# Patient Record
Sex: Male | Born: 1950
Health system: Southern US, Community
[De-identification: ages and names within clinical notes are randomized; demographics above are authoritative.]

## PROBLEM LIST (undated history)

## (undated) DIAGNOSIS — G44009 Cluster headache syndrome, unspecified, not intractable: Secondary | ICD-10-CM

## (undated) DIAGNOSIS — IMO0001 Reserved for inherently not codable concepts without codable children: Secondary | ICD-10-CM

## (undated) DIAGNOSIS — J45909 Unspecified asthma, uncomplicated: Secondary | ICD-10-CM

## (undated) DIAGNOSIS — K219 Gastro-esophageal reflux disease without esophagitis: Secondary | ICD-10-CM

## (undated) DIAGNOSIS — J329 Chronic sinusitis, unspecified: Secondary | ICD-10-CM

## (undated) DIAGNOSIS — C61 Malignant neoplasm of prostate: Secondary | ICD-10-CM

## (undated) HISTORY — PX: KNEE ARTHROPLASTY: SHX992

## (undated) HISTORY — DX: Unspecified asthma, uncomplicated: J45.909

## (undated) HISTORY — DX: Chronic sinusitis, unspecified: J32.9

## (undated) HISTORY — DX: Reserved for inherently not codable concepts without codable children: IMO0001

## (undated) HISTORY — DX: Gastro-esophageal reflux disease without esophagitis: K21.9

## (undated) HISTORY — DX: Cluster headache syndrome, unspecified, not intractable: G44.009

## (undated) HISTORY — DX: Malignant neoplasm of prostate: C61

## (undated) HISTORY — PX: SINOSCOPY: SHX187

## (undated) HISTORY — PX: CATARACT EXTRACTION: SUR2

## (undated) HISTORY — PX: OTHER SURGICAL HISTORY: SHX169

---

## 1988-09-13 DIAGNOSIS — T8859XA Other complications of anesthesia, initial encounter: Secondary | ICD-10-CM

## 1988-09-13 HISTORY — DX: Other complications of anesthesia, initial encounter: T88.59XA

## 1998-07-31 ENCOUNTER — Ambulatory Visit (HOSPITAL_COMMUNITY): Admission: RE | Admit: 1998-07-31 | Discharge: 1998-07-31 | Payer: Self-pay | Admitting: Neurological Surgery

## 1998-07-31 ENCOUNTER — Encounter: Payer: Self-pay | Admitting: Neurological Surgery

## 1998-08-18 ENCOUNTER — Encounter: Admission: RE | Admit: 1998-08-18 | Discharge: 1998-11-16 | Payer: Self-pay | Admitting: Neurological Surgery

## 2000-07-14 ENCOUNTER — Encounter: Admission: RE | Admit: 2000-07-14 | Discharge: 2000-07-14 | Payer: Self-pay | Admitting: Orthopedic Surgery

## 2000-07-14 ENCOUNTER — Encounter: Payer: Self-pay | Admitting: Orthopedic Surgery

## 2005-08-13 ENCOUNTER — Encounter: Admission: RE | Admit: 2005-08-13 | Discharge: 2005-08-13 | Payer: Self-pay | Admitting: Orthopaedic Surgery

## 2005-08-17 ENCOUNTER — Ambulatory Visit (HOSPITAL_COMMUNITY): Admission: RE | Admit: 2005-08-17 | Discharge: 2005-08-17 | Payer: Self-pay | Admitting: Orthopaedic Surgery

## 2005-08-17 ENCOUNTER — Ambulatory Visit (HOSPITAL_BASED_OUTPATIENT_CLINIC_OR_DEPARTMENT_OTHER): Admission: RE | Admit: 2005-08-17 | Discharge: 2005-08-17 | Payer: Self-pay | Admitting: Orthopaedic Surgery

## 2007-08-29 ENCOUNTER — Ambulatory Visit (HOSPITAL_BASED_OUTPATIENT_CLINIC_OR_DEPARTMENT_OTHER): Admission: RE | Admit: 2007-08-29 | Discharge: 2007-08-29 | Payer: Self-pay | Admitting: Orthopaedic Surgery

## 2011-01-26 NOTE — Op Note (Signed)
NAMEJOURDON, Kyle [name]         ACCOUNT NO.:  [id number]   MEDICAL RECORD NO.:  [id number]          PATIENT TYPE:  AMB   LOCATION:  DSC                          FACILITY:  [facility]   PHYSICIAN:  Lubertha Basque. Dalldorf, M.D.DATE OF BIRTH:  06-05-1951   DATE OF PROCEDURE:  08/29/2007  DATE OF DISCHARGE:                               OPERATIVE REPORT   PREOPERATIVE DIAGNOSIS:  Left knee torn lateral meniscus.   POSTOPERATIVE DIAGNOSES:  1. Left knee torn lateral meniscus.  2. Left knee chondromalacia, medial and patellofemoral.   PROCEDURES:  1. Left knee partial lateral meniscectomy.  2. Left knee abrasion chondroplasty medial.   ANESTHESIA:  General.   ATTENDING SURGEON:  Marcene Corning, M.D.   ASSISTANT:  Lindwood Qua, P.A.   INDICATIONS FOR PROCEDURE:  The patient is a 60 year old man with a long  history of left knee pain predominantly on the medial aspect.  He has  persisted with swelling and pain for many months.  He has undergone an  MRI scan which has shown a complex lateral meniscus tear.  He has pain  which limits his ability to rest and exercise and he is offered an  arthroscopy.  Informed operative consent was obtained after a discussion  of possible complications of reaction to anesthesia and infection.   SUMMARY OF FINDINGS/PROCEDURE:  Under general anesthesia an arthroscopy  of the left knee was performed.  Suprapatellar pouch was benign while  the patellofemoral joint exhibited some focal grade 3 change addressed  with a brief chondroplasty.  The medial compartment exhibited some grade  3 change with some flaps of articular cartilage which were debrided.  He  had very small focal areas of bare bone addressed with abrasion to  bleeding bone but no broad areas of degenerative change.  The meniscus  itself appeared intact.  The ACL was intact.  In the lateral compartment  he had a complex tear of the lateral meniscus addressed with a partial  lateral meniscectomy  removing about 20% of this structure back to a  stable rim.   DESCRIPTION OF PROCEDURE:  The patient was taken to the operating suite  where a general anesthetic was applied without difficulty.  He was  positioned supine and prepped and draped in normal sterile fashion.  After the administration of IV Kefzol an arthroscopy of the left knee  was performed through a total of two portals.  Findings were as noted  above and the procedure consisted of the chondroplasty abrasion medial  femoral condyle along with a partial lateral meniscectomy done with  basket and shaver.  The knee was thoroughly irrigated followed by  placement of Marcaine with epinephrine and morphine plus some Depo-  Medrol.  Adaptic was placed over the portals followed by dry gauze and a  loose Ace wrap.  Estimated blood loss and intraoperative fluid obtained  from anesthesia records.   DISPOSITION:  The patient was extubated in the operating room and taken  to the recovery in stable condition.  He was to go home the same-day and  follow up in the office next week.  I will contact him by phone tonight.  Lubertha Basque Jerl Santos, M.D.  Electronically Signed     PGD/MEDQ  D:  08/29/2007  T:  08/29/2007  Job:  [id number]

## 2011-01-29 NOTE — Op Note (Signed)
Kyle [name], Kyle [name]         ACCOUNT NO.:  [id number]   MEDICAL RECORD NO.:  [id number]          PATIENT TYPE:  AMB   LOCATION:  DSC                          FACILITY:  [facility]   PHYSICIAN:  Lubertha Basque. Dalldorf, M.D.DATE OF BIRTH:  1951/03/25   DATE OF PROCEDURE:  08/17/2005  DATE OF DISCHARGE:                                 OPERATIVE REPORT   PREOPERATIVE DIAGNOSES:  1.  Right shoulder impingement.  2.  right shoulder SLAP.   POSTOPERATIVE DIAGNOSIS:  1.  Right shoulder impingement.  2.  right shoulder SLAP.   PROCEDURE:  1.  Right shoulder arthroscopic acromioplasty.  2.  Right shoulder arthroscopic partial claviculectomy.  3.  Right shoulder arthroscopic debridement SLAP.   ANESTHESIA:  General.   ATTENDING SURGEON:  Lubertha Basque. Jerl Santos, M.D.   ASSISTANT:  Lindwood Qua, P.A.   INDICATIONS FOR PROCEDURE:  The patient is a 60 year old male with many  months of right dominant shoulder pain. This has persisted despite oral anti-  inflammatories and an exercise program. He did respond, in a transient  fashion, to a subacromial injection. He has pain which limits his ability to  rest and remain active. He has undergone MRI scan which shows things  consistent with impingement as well as a slap injury. He was offered an  arthroscopy. An informed operative consent was obtained, after discussion of  possible complications of, reaction to anesthesia, and infection.   DESCRIPTION OF PROCEDURE:  The patient was taken to the operating suite  where a general anesthetic was applied without difficulty. He was positioned  in beach-chair position and prepped and draped in a normal sterile fashion.  After the administration of IV antibiotic, an arthroscopy of the right  shoulder was performed through a total of three portals. The glenohumeral  joint showed no degenerative changes while anterior and inferior labral  structures were intact. The superior labrum exhibited a type 2 slap  lesion.  The loose fragments were debrided back; and although I could lift the biceps  anchor off the superior glenoid slightly, it did not seem necessary to  perform a repair especially in light of the fact that he did achieve relief  from his shoulder pain after a subacromial injection. I did bur the superior  aspect of the glenoid, in the hopes of creating a bleeding bed of bone; and  this might lead to healing of the mildly displaceable biceps anchor. The  rotator cuff was examined from below and was intact. The biceps tendon  throughout the glenohumeral joint appeared benign. In the subacromial space  he had a great deal of bursitis, addressed with a thorough bursectomy and  there was some mild fraying of the rotator cuff consistent with impingement.  An acromioplasty was done changing his unfavorable subacromial morphology  back to a flat type-1 morphology. He did have some impingement at the [facility]  joint as well, and a partial claviculectomy was done without a formal AC  decompression as I feared that this might destabilize the [facility] joint which had  been reconstructed in two stages years earlier. The shoulder was thoroughly  irrigated followed by placement of  Marcaine with epinephrine and morphine.  Simple sutures of nylon were used to loosely reapproximate the portals,  followed by Adaptic, and dry gauze dressing with tape. Estimated blood loss  and intraoperative fluids can be obtained from anesthesia records.   DISPOSITION:  The patient was extubated in the operating room and taken to  recovery room in stable condition.   PLANS:  The plans were for him to go home the same day and to followup in  the office in less than a week. I will contact him by phone tonight.      Lubertha Basque Jerl Santos, M.D.  Electronically Signed     PGD/MEDQ  D:  08/17/2005  T:  08/17/2005  Job:  [id number]

## 2011-06-18 LAB — POCT HEMOGLOBIN-HEMACUE
Hemoglobin: 14.3
Operator id: 123881

## 2014-06-10 ENCOUNTER — Other Ambulatory Visit: Payer: Self-pay | Admitting: Orthopaedic Surgery

## 2014-06-10 DIAGNOSIS — M545 Low back pain, unspecified: Secondary | ICD-10-CM

## 2014-06-10 DIAGNOSIS — M5416 Radiculopathy, lumbar region: Secondary | ICD-10-CM

## 2014-06-13 ENCOUNTER — Ambulatory Visit
Admission: RE | Admit: 2014-06-13 | Discharge: 2014-06-13 | Disposition: A | Discharge status: Home / Self Care | Payer: Federal, State, Local not specified - PPO | Source: Ambulatory Visit | Attending: Orthopaedic Surgery | Admitting: Orthopaedic Surgery

## 2014-06-13 DIAGNOSIS — M5416 Radiculopathy, lumbar region: Secondary | ICD-10-CM

## 2014-06-13 DIAGNOSIS — M545 Low back pain, unspecified: Secondary | ICD-10-CM

## 2015-07-07 ENCOUNTER — Other Ambulatory Visit: Payer: Self-pay | Admitting: *Deleted

## 2015-07-07 MED ORDER — ESOMEPRAZOLE MAGNESIUM 40 MG PO CPDR: 40.0000 mg | DELAYED_RELEASE_CAPSULE | Freq: Every day | ORAL | Status: DC

## 2015-10-01 DIAGNOSIS — N401 Enlarged prostate with lower urinary tract symptoms: Secondary | ICD-10-CM | POA: Diagnosis not present

## 2015-10-01 DIAGNOSIS — N486 Induration penis plastica: Secondary | ICD-10-CM | POA: Diagnosis not present

## 2015-10-01 DIAGNOSIS — R972 Elevated prostate specific antigen [PSA]: Secondary | ICD-10-CM | POA: Diagnosis not present

## 2015-10-09 DIAGNOSIS — Z6826 Body mass index (BMI) 26.0-26.9, adult: Secondary | ICD-10-CM | POA: Diagnosis not present

## 2015-10-09 DIAGNOSIS — J301 Allergic rhinitis due to pollen: Secondary | ICD-10-CM | POA: Diagnosis not present

## 2015-10-09 DIAGNOSIS — J323 Chronic sphenoidal sinusitis: Secondary | ICD-10-CM | POA: Diagnosis not present

## 2015-10-09 DIAGNOSIS — J322 Chronic ethmoidal sinusitis: Secondary | ICD-10-CM | POA: Diagnosis not present

## 2015-10-09 DIAGNOSIS — J320 Chronic maxillary sinusitis: Secondary | ICD-10-CM | POA: Diagnosis not present

## 2015-10-09 DIAGNOSIS — J45909 Unspecified asthma, uncomplicated: Secondary | ICD-10-CM | POA: Diagnosis not present

## 2015-10-09 DIAGNOSIS — J321 Chronic frontal sinusitis: Secondary | ICD-10-CM | POA: Diagnosis not present

## 2015-10-23 DIAGNOSIS — L578 Other skin changes due to chronic exposure to nonionizing radiation: Secondary | ICD-10-CM | POA: Diagnosis not present

## 2015-10-23 DIAGNOSIS — L820 Inflamed seborrheic keratosis: Secondary | ICD-10-CM | POA: Diagnosis not present

## 2015-11-11 ENCOUNTER — Encounter: Payer: Self-pay | Admitting: Allergy and Immunology

## 2015-11-11 ENCOUNTER — Ambulatory Visit (INDEPENDENT_AMBULATORY_CARE_PROVIDER_SITE_OTHER): Payer: Medicare Other | Admitting: Allergy and Immunology

## 2015-11-11 VITALS — BP 140/78 | HR 68 | Temp 97.9°F | Resp 18 | Ht 72.05 in | Wt 202.8 lb

## 2015-11-11 DIAGNOSIS — J4540 Moderate persistent asthma, uncomplicated: Secondary | ICD-10-CM

## 2015-11-11 DIAGNOSIS — J309 Allergic rhinitis, unspecified: Secondary | ICD-10-CM

## 2015-11-11 DIAGNOSIS — B370 Candidal stomatitis: Secondary | ICD-10-CM | POA: Diagnosis not present

## 2015-11-11 DIAGNOSIS — K219 Gastro-esophageal reflux disease without esophagitis: Secondary | ICD-10-CM

## 2015-11-11 DIAGNOSIS — H1010 Acute atopic conjunctivitis, unspecified eye: Secondary | ICD-10-CM | POA: Diagnosis not present

## 2015-11-11 DIAGNOSIS — J387 Other diseases of larynx: Secondary | ICD-10-CM | POA: Diagnosis not present

## 2015-11-11 MED ORDER — BECLOMETHASONE DIPROPIONATE 80 MCG/ACT IN AERS: 2.0000 | INHALATION_SPRAY | Freq: Two times a day (BID) | RESPIRATORY_TRACT | Status: DC

## 2015-11-11 MED ORDER — ESOMEPRAZOLE MAGNESIUM 40 MG PO CPDR: 40.0000 mg | DELAYED_RELEASE_CAPSULE | Freq: Every day | ORAL | Status: DC

## 2015-11-11 MED ORDER — FLUCONAZOLE 150 MG PO TABS: 150.0000 mg | ORAL_TABLET | Freq: Every day | ORAL | Status: DC

## 2015-11-11 MED ORDER — ALBUTEROL SULFATE HFA 108 (90 BASE) MCG/ACT IN AERS: 2.0000 | INHALATION_SPRAY | RESPIRATORY_TRACT | Status: DC | PRN

## 2015-11-11 MED ORDER — BUDESONIDE-FORMOTEROL FUMARATE 160-4.5 MCG/ACT IN AERO: 2.0000 | INHALATION_SPRAY | Freq: Two times a day (BID) | RESPIRATORY_TRACT | Status: DC

## 2015-11-11 MED ORDER — MOMETASONE FUROATE 50 MCG/ACT NA SUSP
NASAL | Status: DC
Start: 2015-11-11 — End: 2016-04-21

## 2015-11-11 NOTE — Progress Notes (Addendum)
Follow-up Note  Referring Provider: No ref. provider found Primary Provider: No primary care provider on file. Date of Office Visit: 11/11/2015  Subjective:   Kyle [name] (DOB: Apr 09, 1951) is a 65 y.o. male who returns to the [facility] [place] on 11/11/2015 in re-evaluation of the following:  HPI Comments: Kyle [name] presents to this clinic in reevaluation of his asthma, allergic rhinitis, history of chronic sinusitis requiring sinus, and GERD. He is done quite well over the course of the past year with minimal problems revolving around his asthma, no need to use a systemic steroid to control an asthma flare, no problem with exercise, and minimal use of Ventolin. As well, he's had very little problems with his nose and very little problems with reflux while consistently using medical therapy. Currently his medications include Symbicort 160 2 inhalations twice a day,, nasal one or 2 sprays each nostril daily, Nexium 40 mg daily.  Unfortunately, he developed aches and fatigue and low-grade fever this weekend. He felt like he just ran a marathon and found it very difficult to get out of bed. He had a band across his chest and some little bit of nasal congestion. He added Qvar to his Symbicort. Over the course the past 24 hours he is much better and is resolved the majority of these issues. He did get a flu vaccine earlier this year.   Outpatient Prescriptions Prior to Visit  Medication Sig Dispense Refill  . albuterol (VENTOLIN HFA) 108 (90 BASE) MCG/ACT inhaler Inhale 2 puffs into the lungs every 6 (six) hours as needed for wheezing or shortness of breath.    . beclomethasone (QVAR) 80 MCG/ACT inhaler Inhale 2 puffs into the lungs 2 (two) times daily. Reported on 11/11/2015    . budesonide-formoterol (SYMBICORT) 160-4.5 MCG/ACT inhaler Inhale 2 puffs into the lungs 2 (two) times daily.    Marland Kitchen esomeprazole (NEXIUM) 40 MG capsule Take 1 capsule (40 mg total) by mouth daily at 12 noon.  (Patient taking differently: Take 40 mg by mouth daily. ) 90 capsule 1  . esomeprazole (NEXIUM) 40 MG capsule Take 40 mg by mouth daily at 12 noon. Reported on 11/11/2015     No facility-administered medications prior to visit.    Meds ordered this encounter  Medications  . fluconazole (DIFLUCAN) 150 MG tablet    Sig: Take 1 tablet (150 mg total) by mouth daily.    Dispense:  1 tablet    Refill:  0  . budesonide-formoterol (SYMBICORT) 160-4.5 MCG/ACT inhaler    Sig: Inhale 2 puffs into the lungs 2 (two) times daily. To prevent cough or wheeze    Dispense:  3 Inhaler    Refill:  1  . esomeprazole (NEXIUM) 40 MG capsule    Sig: Take 1 capsule (40 mg total) by mouth daily.    Dispense:  90 capsule    Refill:  1  . beclomethasone (QVAR) 80 MCG/ACT inhaler    Sig: Inhale 2 puffs into the lungs 2 (two) times daily. Reported on 11/11/2015    Dispense:  3 Inhaler    Refill:  1  . albuterol (VENTOLIN HFA) 108 (90 Base) MCG/ACT inhaler    Sig: Inhale 2 puffs into the lungs every 4 (four) hours as needed for wheezing or shortness of breath.    Dispense:  3 Inhaler    Refill:  1  . mometasone (NASONEX) 50 MCG/ACT nasal spray    Sig: Use 1-2 sprays each nostril once daily to  prevent cough or wheeze    Dispense:  51 g    Refill:  1    Past Medical History  Diagnosis Date  . Asthma   . Sinusitis   . Reflux     Past Surgical History  Procedure Laterality Date  . Sinoscopy      6 SURGERIES  . Knee arthroplasty      Allergies  Allergen Reactions  . Other     LEVAQUIN    Review of systems negative except as noted in HPI / PMHx or noted below:  Review of Systems  Constitutional: Negative.   HENT: Negative.   Eyes: Negative.   Respiratory: Negative.   Cardiovascular: Negative.   Gastrointestinal: Negative.   Genitourinary: Negative.   Musculoskeletal: Negative.   Skin: Negative.   Neurological: Negative.   Endo/Heme/Allergies: Negative.   Psychiatric/Behavioral: Negative.       Objective:   Filed Vitals:   11/11/15 0812  BP: 140/78  Pulse: 68  Temp: 97.9 F (36.6 C)  Resp: 18   Height: 6' 0.05" (183 cm)  Weight: 202 lb 13.2 oz (92 kg)   Physical Exam  Constitutional: He is well-developed, well-nourished, and in no distress.  HENT:  Head: Normocephalic.  Right Ear: Tympanic membrane, external ear and ear canal normal.  Left Ear: Tympanic membrane, external ear and ear canal normal.  Nose: Nose normal. No mucosal edema or rhinorrhea.  Mouth/Throat: Uvula is midline and mucous membranes are normal. Oropharyngeal exudate (thrush) present.     Eyes: Conjunctivae are normal.  Neck: Trachea normal. No tracheal tenderness present. No tracheal deviation present. No thyromegaly present.  Cardiovascular: Normal rate, regular rhythm, S1 normal, S2 normal and normal heart sounds.   No murmur heard. Pulmonary/Chest: Breath sounds normal. No stridor. No respiratory distress. He has no wheezes. He has no rales.  Musculoskeletal: He exhibits no edema.  Lymphadenopathy:       Head (right side): No tonsillar adenopathy present.       Head (left side): No tonsillar adenopathy present.    He has no cervical adenopathy.    He has no axillary adenopathy.  Neurological: He is alert. Gait normal.  Skin: No rash noted. He is not diaphoretic. No erythema. Nails show no clubbing.  Psychiatric: Mood and affect normal.    Diagnostics:    Spirometry was performed and demonstrated an FEV1 of 2.72 at 72 % of predicted.  The patient had an Asthma Control Test with the following results:  .    Assessment and Plan:   1. Moderate persistent asthma, uncomplicated   2. Allergic rhinoconjunctivitis   3. LPRD (laryngopharyngeal reflux disease)   4. Thrush      1. Diflucan 150 mg tablet today  2. Symbicort 160- 2 inhalations twice a day   3. Add Qvar 2 inhalations twice a day to Symbicort during "flareup"  4. Nasonex 1-2 sprays each nostril one time per day   5.  Nexium 40 mg one tablet once a day   6. Ventolin HFA 2 puffs every 4-6 hours if needed   7. Over-the-counter nasal saline and antihistamine if needed   8. Return to clinic in 1 year or earlier if problem    Overall Kyle [name] is had an excellent year and I see no need for changing his medical plan. We will renew his medications and see him back in this clinic in approximately one year. He has a very good understanding about his disease process and how the medications work  and the appropriate use medications. Certainly what he just went through over this week and appeared to be viral in nature and was most likely influenza given what is going on in the community this past few weeks.  Allena Katz, MD [place]

## 2015-11-11 NOTE — Patient Instructions (Signed)
  1. Diflucan 150 mg tablet today  2. Symbicort 160- 2 inhalations twice a day   3. Add Qvar 2 inhalations twice a day to Symbicort during "flareup"  4. Nasonex 1-2 sprays each nostril one time per day   5. Nexium 40 mg one tablet once a day   6. Ventolin HFA 2 puffs every 4-6 hours if needed   7. Over-the-counter nasal saline and antihistamine if needed   8. Return to clinic in 1 year or earlier if problem

## 2015-11-20 DIAGNOSIS — J0110 Acute frontal sinusitis, unspecified: Secondary | ICD-10-CM | POA: Diagnosis not present

## 2015-11-20 DIAGNOSIS — J339 Nasal polyp, unspecified: Secondary | ICD-10-CM | POA: Diagnosis not present

## 2015-11-20 DIAGNOSIS — H04302 Unspecified dacryocystitis of left lacrimal passage: Secondary | ICD-10-CM | POA: Diagnosis not present

## 2015-11-20 DIAGNOSIS — Z6826 Body mass index (BMI) 26.0-26.9, adult: Secondary | ICD-10-CM | POA: Diagnosis not present

## 2015-11-20 DIAGNOSIS — J324 Chronic pansinusitis: Secondary | ICD-10-CM | POA: Diagnosis not present

## 2015-12-08 DIAGNOSIS — Z6827 Body mass index (BMI) 27.0-27.9, adult: Secondary | ICD-10-CM | POA: Diagnosis not present

## 2015-12-08 DIAGNOSIS — R531 Weakness: Secondary | ICD-10-CM | POA: Diagnosis not present

## 2015-12-08 DIAGNOSIS — R6889 Other general symptoms and signs: Secondary | ICD-10-CM | POA: Diagnosis not present

## 2015-12-10 ENCOUNTER — Ambulatory Visit (INDEPENDENT_AMBULATORY_CARE_PROVIDER_SITE_OTHER): Payer: Medicare Other | Admitting: Allergy and Immunology

## 2015-12-10 VITALS — BP 140/80 | HR 80 | Resp 18

## 2015-12-10 DIAGNOSIS — J387 Other diseases of larynx: Secondary | ICD-10-CM

## 2015-12-10 DIAGNOSIS — K219 Gastro-esophageal reflux disease without esophagitis: Secondary | ICD-10-CM

## 2015-12-10 DIAGNOSIS — J4541 Moderate persistent asthma with (acute) exacerbation: Secondary | ICD-10-CM | POA: Diagnosis not present

## 2015-12-10 DIAGNOSIS — J309 Allergic rhinitis, unspecified: Secondary | ICD-10-CM

## 2015-12-10 DIAGNOSIS — H1010 Acute atopic conjunctivitis, unspecified eye: Secondary | ICD-10-CM

## 2015-12-10 MED ORDER — METHYLPREDNISOLONE ACETATE 80 MG/ML IJ SUSP
80.0000 mg | Freq: Once | INTRAMUSCULAR | Status: AC
  Administered 2015-12-10: 80 mg via INTRAMUSCULAR

## 2015-12-10 MED ORDER — BUDESONIDE-FORMOTEROL FUMARATE 160-4.5 MCG/ACT IN AERO: 2.0000 | INHALATION_SPRAY | Freq: Two times a day (BID) | RESPIRATORY_TRACT | Status: DC

## 2015-12-10 NOTE — Patient Instructions (Signed)
  1. Depo-Medrol 80 IM delivered in clinic today  2. Symbicort 160- 2 inhalations twice a day   3. Add Qvar 2 inhalations twice a day to Symbicort during "flareup"  4. Nasonex 1-2 sprays each nostril one time per day   5. Nexium 40 mg one tablet once a day   6. Ventolin HFA 2 puffs every 4-6 hours if needed   7. Over-the-counter nasal saline and antihistamine if needed   8. Return to clinic in 1 year or earlier if problem
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
## 2015-12-11 ENCOUNTER — Encounter: Payer: Self-pay | Admitting: Allergy and Immunology

## 2015-12-29 DIAGNOSIS — J329 Chronic sinusitis, unspecified: Secondary | ICD-10-CM | POA: Diagnosis not present

## 2015-12-29 DIAGNOSIS — H04552 Acquired stenosis of left nasolacrimal duct: Secondary | ICD-10-CM | POA: Diagnosis not present

## 2015-12-29 DIAGNOSIS — H269 Unspecified cataract: Secondary | ICD-10-CM | POA: Diagnosis not present

## 2015-12-29 DIAGNOSIS — Z23 Encounter for immunization: Secondary | ICD-10-CM | POA: Diagnosis not present

## 2016-02-03 DIAGNOSIS — H04552 Acquired stenosis of left nasolacrimal duct: Secondary | ICD-10-CM | POA: Diagnosis not present

## 2016-03-15 DIAGNOSIS — M25571 Pain in right ankle and joints of right foot: Secondary | ICD-10-CM | POA: Diagnosis not present

## 2016-04-20 DIAGNOSIS — N401 Enlarged prostate with lower urinary tract symptoms: Secondary | ICD-10-CM | POA: Diagnosis not present

## 2016-04-20 DIAGNOSIS — N486 Induration penis plastica: Secondary | ICD-10-CM | POA: Diagnosis not present

## 2016-04-20 DIAGNOSIS — R972 Elevated prostate specific antigen [PSA]: Secondary | ICD-10-CM | POA: Diagnosis not present

## 2016-04-20 DIAGNOSIS — Z125 Encounter for screening for malignant neoplasm of prostate: Secondary | ICD-10-CM | POA: Diagnosis not present

## 2016-04-21 ENCOUNTER — Other Ambulatory Visit: Payer: Self-pay

## 2016-04-21 MED ORDER — ESOMEPRAZOLE MAGNESIUM 40 MG PO CPDR: 40.0000 mg | DELAYED_RELEASE_CAPSULE | Freq: Every day | ORAL | 1 refills | Status: DC

## 2016-04-21 MED ORDER — MOMETASONE FUROATE 50 MCG/ACT NA SUSP: NASAL | 1 refills | Status: DC

## 2016-04-21 MED ORDER — BECLOMETHASONE DIPROPIONATE 80 MCG/ACT IN AERS: 2.0000 | INHALATION_SPRAY | Freq: Two times a day (BID) | RESPIRATORY_TRACT | 1 refills | Status: DC

## 2016-05-03 DIAGNOSIS — M25571 Pain in right ankle and joints of right foot: Secondary | ICD-10-CM | POA: Diagnosis not present

## 2016-05-10 DIAGNOSIS — M25571 Pain in right ankle and joints of right foot: Secondary | ICD-10-CM | POA: Diagnosis not present

## 2016-05-26 DIAGNOSIS — M25571 Pain in right ankle and joints of right foot: Secondary | ICD-10-CM | POA: Diagnosis not present

## 2016-06-11 DIAGNOSIS — H2513 Age-related nuclear cataract, bilateral: Secondary | ICD-10-CM | POA: Diagnosis not present

## 2016-06-11 DIAGNOSIS — H524 Presbyopia: Secondary | ICD-10-CM | POA: Diagnosis not present

## 2016-08-17 DIAGNOSIS — L820 Inflamed seborrheic keratosis: Secondary | ICD-10-CM | POA: Diagnosis not present

## 2016-08-17 DIAGNOSIS — D229 Melanocytic nevi, unspecified: Secondary | ICD-10-CM | POA: Diagnosis not present

## 2016-09-09 DIAGNOSIS — Z6826 Body mass index (BMI) 26.0-26.9, adult: Secondary | ICD-10-CM | POA: Diagnosis not present

## 2016-09-09 DIAGNOSIS — Z1389 Encounter for screening for other disorder: Secondary | ICD-10-CM | POA: Diagnosis not present

## 2016-09-09 DIAGNOSIS — K589 Irritable bowel syndrome without diarrhea: Secondary | ICD-10-CM | POA: Diagnosis not present

## 2016-09-09 DIAGNOSIS — Z1211 Encounter for screening for malignant neoplasm of colon: Secondary | ICD-10-CM | POA: Diagnosis not present

## 2016-09-09 DIAGNOSIS — E663 Overweight: Secondary | ICD-10-CM | POA: Diagnosis not present

## 2016-09-09 DIAGNOSIS — Z9181 History of falling: Secondary | ICD-10-CM | POA: Diagnosis not present

## 2016-09-09 DIAGNOSIS — K219 Gastro-esophageal reflux disease without esophagitis: Secondary | ICD-10-CM | POA: Diagnosis not present

## 2016-09-09 DIAGNOSIS — F5104 Psychophysiologic insomnia: Secondary | ICD-10-CM | POA: Diagnosis not present

## 2016-09-22 DIAGNOSIS — R1032 Left lower quadrant pain: Secondary | ICD-10-CM | POA: Diagnosis not present

## 2016-09-22 DIAGNOSIS — K219 Gastro-esophageal reflux disease without esophagitis: Secondary | ICD-10-CM | POA: Diagnosis not present

## 2016-09-22 DIAGNOSIS — K591 Functional diarrhea: Secondary | ICD-10-CM | POA: Diagnosis not present

## 2016-10-01 DIAGNOSIS — D120 Benign neoplasm of cecum: Secondary | ICD-10-CM | POA: Diagnosis not present

## 2016-10-01 DIAGNOSIS — M1990 Unspecified osteoarthritis, unspecified site: Secondary | ICD-10-CM | POA: Diagnosis not present

## 2016-10-01 DIAGNOSIS — Z1211 Encounter for screening for malignant neoplasm of colon: Secondary | ICD-10-CM | POA: Diagnosis not present

## 2016-10-01 DIAGNOSIS — D649 Anemia, unspecified: Secondary | ICD-10-CM | POA: Diagnosis not present

## 2016-10-01 DIAGNOSIS — K219 Gastro-esophageal reflux disease without esophagitis: Secondary | ICD-10-CM | POA: Diagnosis not present

## 2016-10-01 DIAGNOSIS — D122 Benign neoplasm of ascending colon: Secondary | ICD-10-CM | POA: Diagnosis not present

## 2016-10-01 DIAGNOSIS — Z79899 Other long term (current) drug therapy: Secondary | ICD-10-CM | POA: Diagnosis not present

## 2016-10-01 DIAGNOSIS — R109 Unspecified abdominal pain: Secondary | ICD-10-CM | POA: Diagnosis not present

## 2016-10-01 DIAGNOSIS — K449 Diaphragmatic hernia without obstruction or gangrene: Secondary | ICD-10-CM | POA: Diagnosis not present

## 2016-10-04 DIAGNOSIS — R1013 Epigastric pain: Secondary | ICD-10-CM | POA: Diagnosis not present

## 2016-10-04 DIAGNOSIS — Z6826 Body mass index (BMI) 26.0-26.9, adult: Secondary | ICD-10-CM | POA: Diagnosis not present

## 2016-10-05 DIAGNOSIS — R1013 Epigastric pain: Secondary | ICD-10-CM | POA: Diagnosis not present

## 2016-10-05 DIAGNOSIS — J9811 Atelectasis: Secondary | ICD-10-CM | POA: Diagnosis not present

## 2016-10-18 DIAGNOSIS — L578 Other skin changes due to chronic exposure to nonionizing radiation: Secondary | ICD-10-CM | POA: Diagnosis not present

## 2016-10-18 DIAGNOSIS — C44529 Squamous cell carcinoma of skin of other part of trunk: Secondary | ICD-10-CM | POA: Diagnosis not present

## 2016-10-18 DIAGNOSIS — L821 Other seborrheic keratosis: Secondary | ICD-10-CM | POA: Diagnosis not present

## 2016-10-18 DIAGNOSIS — L820 Inflamed seborrheic keratosis: Secondary | ICD-10-CM | POA: Diagnosis not present

## 2016-10-21 DIAGNOSIS — N401 Enlarged prostate with lower urinary tract symptoms: Secondary | ICD-10-CM | POA: Diagnosis not present

## 2016-10-21 DIAGNOSIS — R972 Elevated prostate specific antigen [PSA]: Secondary | ICD-10-CM | POA: Diagnosis not present

## 2016-10-21 DIAGNOSIS — N486 Induration penis plastica: Secondary | ICD-10-CM | POA: Diagnosis not present

## 2016-11-08 ENCOUNTER — Other Ambulatory Visit: Payer: Self-pay | Admitting: Allergy and Immunology

## 2016-11-18 ENCOUNTER — Encounter: Payer: Self-pay | Admitting: Allergy and Immunology

## 2016-11-18 ENCOUNTER — Ambulatory Visit (INDEPENDENT_AMBULATORY_CARE_PROVIDER_SITE_OTHER): Payer: Medicare Other | Admitting: Allergy and Immunology

## 2016-11-18 VITALS — BP 140/84 | HR 72 | Resp 20

## 2016-11-18 DIAGNOSIS — K219 Gastro-esophageal reflux disease without esophagitis: Secondary | ICD-10-CM

## 2016-11-18 DIAGNOSIS — J3089 Other allergic rhinitis: Secondary | ICD-10-CM | POA: Diagnosis not present

## 2016-11-18 DIAGNOSIS — B370 Candidal stomatitis: Secondary | ICD-10-CM

## 2016-11-18 DIAGNOSIS — J4541 Moderate persistent asthma with (acute) exacerbation: Secondary | ICD-10-CM

## 2016-11-18 MED ORDER — ESOMEPRAZOLE MAGNESIUM 40 MG PO CPDR: 40.0000 mg | DELAYED_RELEASE_CAPSULE | Freq: Every day | ORAL | 3 refills | Status: DC

## 2016-11-18 MED ORDER — BECLOMETHASONE DIPROP HFA 80 MCG/ACT IN AERB: 2.0000 | INHALATION_SPRAY | Freq: Two times a day (BID) | RESPIRATORY_TRACT | 3 refills | Status: DC | PRN

## 2016-11-18 MED ORDER — METHYLPREDNISOLONE ACETATE 80 MG/ML IJ SUSP
80.0000 mg | Freq: Once | INTRAMUSCULAR | Status: AC
  Administered 2016-11-18: 80 mg via INTRAMUSCULAR

## 2016-11-18 MED ORDER — MOMETASONE FUROATE 50 MCG/ACT NA SUSP: NASAL | 3 refills | Status: DC

## 2016-11-18 MED ORDER — BUDESONIDE-FORMOTEROL FUMARATE 160-4.5 MCG/ACT IN AERO
INHALATION_SPRAY | RESPIRATORY_TRACT | 3 refills | Status: DC
Start: 2016-11-18 — End: 2017-12-02

## 2016-11-18 MED ORDER — FLUCONAZOLE 150 MG PO TABS: 150.0000 mg | ORAL_TABLET | Freq: Once | ORAL | 0 refills | Status: AC

## 2016-11-18 NOTE — Patient Instructions (Addendum)
  1. Depo-Medrol 80 IM delivered in clinic today  2. Diflucan 150 tablet today  3. Symbicort 160- 2 inhalations twice a day   4. Add Qvar 2 inhalations twice a day to Symbicort during "flareup"  5. Nasonex 1-2 sprays each nostril one time per day   6. Nexium 40 mg one tablet once a day. Can increase to twice a day during "flareup"  7. Ventolin HFA 2 puffs every 4-6 hours if needed   8. Over-the-counter nasal saline and antihistamine if needed   9. Return to clinic in 1 year or earlier if problem

## 2016-11-18 NOTE — Progress Notes (Signed)
Follow-up Note  Referring Provider: Nicoletta Dress, MD Primary Provider: Nicoletta Dress, MD Date of Office Visit: 11/18/2016  Subjective:   Kyle [name] (DOB: 1950-12-02) is a 66 y.o. male who returns to the [facility] on 11/18/2016 in re-evaluation of the following:  HPI: Kyle [name] returns to this clinic in reevaluation of his asthma, allergic rhinitis, and LPR. I have not seen him in his clinic in approximately one year.  Overall he has done very well and has not required a systemic steroid or an antibiotic to treat any respiratory tract issue during the interval. He can exercise without any difficulty and rarely uses a short acting bronchodilator. His history of chronic sinusitis appears to be inactive. His reflux is under pretty good control as well.  However, approximately 2 weeks ago he started with postnasal drip and throat clearing and coughing and some wheezing and has had to add Qvar to his Symbicort and his been using a short acting bronchodilator. He has not had any fever or ugly nasal discharge or issues with his reflux. He has had some mold exposure as his hot water tank exploded and this was unrecognized for 3-4 days and there was definite mold growth.  He did receive the flu vaccine this year.  Allergies as of 11/18/2016      Reactions   Other    LEVAQUIN      Medication List      albuterol 108 (90 Base) MCG/ACT inhaler Commonly known as:  VENTOLIN HFA Inhale 2 puffs into the lungs every 4 (four) hours as needed for wheezing or shortness of breath.   beclomethasone 80 MCG/ACT inhaler Commonly known as:  QVAR Inhale 2 puffs into the lungs 2 (two) times daily. Reported on 11/11/2015   budesonide-formoterol 160-4.5 MCG/ACT inhaler Commonly known as:  SYMBICORT Inhale 2 puffs into the lungs 2 (two) times daily. To prevent cough or wheeze   esomeprazole 40 MG capsule Commonly known as:  NEXIUM Take 1 capsule (40 mg total) by mouth  daily.   mometasone 50 MCG/ACT nasal spray Commonly known as:  NASONEX Use 1-2 sprays each nostril once daily to prevent cough or wheeze       Past Medical History:  Diagnosis Date  . Asthma   . Reflux   . Sinusitis     Past Surgical History:  Procedure Laterality Date  . KNEE ARTHROPLASTY    . SINOSCOPY     6 SURGERIES    Review of systems negative except as noted in HPI / PMHx or noted below:  Review of Systems  Constitutional: Negative.   HENT: Negative.   Eyes: Negative.   Respiratory: Negative.   Cardiovascular: Negative.   Gastrointestinal: Negative.   Genitourinary: Negative.   Musculoskeletal: Negative.   Skin: Negative.   Neurological: Negative.   Endo/Heme/Allergies: Negative.   Psychiatric/Behavioral: Negative.      Objective:   Vitals:   11/18/16 0837  BP: 140/84  Pulse: 72  Resp: 20          Physical Exam  Constitutional: He is well-developed, well-nourished, and in no distress.  Throat clearing  HENT:  Head: Normocephalic.  Right Ear: Tympanic membrane, external ear and ear canal normal.  Left Ear: Tympanic membrane, external ear and ear canal normal.  Nose: Nose normal. No mucosal edema or rhinorrhea.  Mouth/Throat: Uvula is midline and mucous membranes are normal. Oropharyngeal exudate (thrush) present.  Eyes: Conjunctivae are normal.  Neck: Trachea normal. No tracheal  tenderness present. No tracheal deviation present. No thyromegaly present.  Cardiovascular: Normal rate, regular rhythm, S1 normal, S2 normal and normal heart sounds.   No murmur heard. Pulmonary/Chest: Breath sounds normal. No stridor. No respiratory distress. He has no wheezes. He has no rales.  Musculoskeletal: He exhibits no edema.  Lymphadenopathy:       Head (right side): No tonsillar adenopathy present.       Head (left side): No tonsillar adenopathy present.    He has no cervical adenopathy.  Neurological: He is alert. Gait normal.  Skin: No rash noted. He  is not diaphoretic. No erythema. Nails show no clubbing.  Psychiatric: Mood and affect normal.    Diagnostics:    Spirometry was performed and demonstrated an FEV1 of 2.88 at 104 % of predicted.  Assessment and Plan:   1. Asthma, not well controlled, moderate persistent, with acute exacerbation   2. Other allergic rhinitis   3. LPRD (laryngopharyngeal reflux disease)   4. Thrush     1. Depo-Medrol 80 IM delivered in clinic today  2. Diflucan 150 tablet today  3. Symbicort 160- 2 inhalations twice a day   4. Add Qvar 2 inhalations twice a day to Symbicort during "flareup"  5. Nasonex 1-2 sprays each nostril one time per day   6. Nexium 40 mg one tablet once a day. Can increase to twice a day during "flareup"  7. Ventolin HFA 2 puffs every 4-6 hours if needed   8. Over-the-counter nasal saline and antihistamine if needed   9. Return to clinic in 1 year or earlier if problem   Kyle [name] appears to have a slight exacerbation of his respiratory tract issue and I will treat him with a systemic steroid as noted above. This will be his first systemic steroid in 1 year. As well, there does appear to be some issues with thrush and he'll use a single Diflucan tablet. He'll continue to use anti-inflammatory medications and therapy for reflux as noted above. I'll see him back in this clinic in 1 year or earlier if there is a problem.  Allena Katz, MD Allergy / Immunology [place]

## 2016-11-22 MED ORDER — METHYLPREDNISOLONE ACETATE 80 MG/ML IJ SUSP: 80.0000 mg | Freq: Once | INTRAMUSCULAR | Status: DC

## 2016-11-26 ENCOUNTER — Ambulatory Visit: Payer: Medicare Other | Admitting: Allergy and Immunology

## 2016-11-26 ENCOUNTER — Encounter: Payer: Self-pay | Admitting: Allergy and Immunology

## 2016-11-26 VITALS — BP 130/70 | HR 72 | Temp 98.0°F | Resp 18

## 2016-11-26 DIAGNOSIS — J3089 Other allergic rhinitis: Secondary | ICD-10-CM

## 2016-11-26 DIAGNOSIS — J4541 Moderate persistent asthma with (acute) exacerbation: Secondary | ICD-10-CM

## 2016-11-26 DIAGNOSIS — K219 Gastro-esophageal reflux disease without esophagitis: Secondary | ICD-10-CM

## 2016-11-26 NOTE — Progress Notes (Signed)
Follow-up Note  Referring Provider: Nicoletta Dress, MD Primary Provider: Nicoletta Dress, MD Date of Office Visit: 11/26/2016  Subjective:   Kyle [name] (DOB: [birth year]) is a 66 y.o. male who returns to the [facility] on 11/26/2016 in re-evaluation of the following:  HPI: Kyle [name] returns to this clinic noting that since his evaluation of 11/18/2016 at which point in time he appeared to have an exacerbation of his asthma he has not done well. One day after visiting with me he developed achiness and just feeling bad and run down and that has still continued. He's not sleeping as well as he was in the past. He has not had any high fever or ugly nasal discharge. He's actually doing relatively well regarding his chest. He does not need to use a short acting bronchodilator. In fact, short acting bronchodilator doesn't really do anything with his chest at this point. The only thing it does do is irritated his throat at this point. He does believe that the thrush that he had during his last visit has resolved with his Diflucan. His reflux is under good control at this point while consistently using his Nexium presently twice while he is flared up.  Allergies as of 11/26/2016      Reactions   Other    LEVAQUIN      Medication List      albuterol 108 (90 Base) MCG/ACT inhaler Commonly known as:  VENTOLIN HFA Inhale 2 puffs into the lungs every 4 (four) hours as needed for wheezing or shortness of breath.   Beclomethasone Diprop HFA 80 MCG/ACT Aerb Commonly known as:  QVAR REDIHALER Inhale 2 Doses into the lungs 2 (two) times daily as needed (during asthma flare-up). Rinse, gargle, and spit after use.   budesonide-formoterol 160-4.5 MCG/ACT inhaler Commonly known as:  SYMBICORT Inhale two puffs twice a day to prevent cough or wheeze.  Rinse, gargle, and spit after use.   dicyclomine 20 MG tablet Commonly known as:  BENTYL Take 20 mg by mouth 4 (four) times  daily.   esomeprazole 40 MG capsule Commonly known as:  NEXIUM Take 1 capsule (40 mg total) by mouth daily. Can increase to twice a day during flare-up.   mometasone 50 MCG/ACT nasal spray Commonly known as:  NASONEX Use 1-2 sprays in each nostril once daily.   oseltamivir 75 MG capsule Commonly known as:  TAMIFLU Take 75 mg by mouth daily.   zolpidem 10 MG tablet Commonly known as:  AMBIEN Take 10 mg by mouth at bedtime as needed. for sleep       Past Medical History:  Diagnosis Date  . Asthma   . Reflux   . Sinusitis     Past Surgical History:  Procedure Laterality Date  . KNEE ARTHROPLASTY    . SINOSCOPY     6 SURGERIES    Review of systems negative except as noted in HPI / PMHx or noted below:  Review of Systems  Constitutional: Negative.   HENT: Negative.   Eyes: Negative.   Respiratory: Negative.   Cardiovascular: Negative.   Gastrointestinal: Negative.   Genitourinary: Negative.   Musculoskeletal: Negative.   Skin: Negative.   Neurological: Negative.   Endo/Heme/Allergies: Negative.   Psychiatric/Behavioral: Negative.      Objective:   Vitals:   11/26/16 1116  BP: 130/70  Pulse: 72  Resp: 18  Temp: 98 F (36.7 C)          Physical Exam  Constitutional: He is well-developed, well-nourished, and in no distress.  HENT:  Head: Normocephalic.  Right Ear: Tympanic membrane, external ear and ear canal normal.  Left Ear: Tympanic membrane, external ear and ear canal normal.  Nose: Nose normal. No mucosal edema or rhinorrhea.  Mouth/Throat: Uvula is midline, oropharynx is clear and moist and mucous membranes are normal. No oropharyngeal exudate (No thrush).  Eyes: Conjunctivae are normal.  Neck: Trachea normal. No tracheal tenderness present. No tracheal deviation present. No thyromegaly present.  Cardiovascular: Normal rate, regular rhythm, S1 normal, S2 normal and normal heart sounds.   No murmur heard. Pulmonary/Chest: Breath sounds  normal. No stridor. No respiratory distress. He has no wheezes. He has no rales.  Musculoskeletal: He exhibits no edema.  Lymphadenopathy:       Head (right side): No tonsillar adenopathy present.       Head (left side): No tonsillar adenopathy present.    He has no cervical adenopathy.  Neurological: He is alert. Gait normal.  Skin: No rash noted. He is not diaphoretic. No erythema. Nails show no clubbing.  Psychiatric: Mood and affect normal.    Diagnostics:    Spirometry was performed and demonstrated an FEV1 of 2.94 at 80 % of predicted.  Assessment and Plan:   1. Asthma, not well controlled, moderate persistent, with acute exacerbation   2. Other allergic rhinitis   3. LPRD (laryngopharyngeal reflux disease)     1. Symbicort 160- 2 inhalations twice a day   2. Add Qvar 2 inhalations twice a day to Symbicort during "flareup"  3. Nasonex 1-2 sprays each nostril one time per day   4. Nexium 40 mg one tablet once a day. Can increase to twice a day during "flareup"  5. Ventolin HFA 2 puffs every 4-6 hours if needed   6. Over-the-counter nasal saline and antihistamine if needed   7. Return to clinic in 1 year or earlier if problem   I think that Kyle [name] has developed some form of viral infection giving rise to his constitutional symptoms and we will assume that over the course of the next week he will do better. His respiratory tract appears to be responding quite well to his current therapy and I see no need for him to alter his anti-inflammatory treatment for his respiratory tract and his treatment directed against reflux at this point in time. Fortunately it does appear as though he responded well to Diflucan for his fungal overgrowth issue. He will keep in contact with me noting his response as he moves forward.  Allena Katz, MD Allergy / Immunology [place]

## 2016-11-26 NOTE — Patient Instructions (Signed)
  1. Symbicort 160- 2 inhalations twice a day   2. Add Qvar 2 inhalations twice a day to Symbicort during "flareup"  3. Nasonex 1-2 sprays each nostril one time per day   4. Nexium 40 mg one tablet once a day. Can increase to twice a day during "flareup"  5. Ventolin HFA 2 puffs every 4-6 hours if needed   6. Over-the-counter nasal saline and antihistamine if needed   7. Return to clinic in 1 year or earlier if problem

## 2016-11-30 DIAGNOSIS — Z9181 History of falling: Secondary | ICD-10-CM | POA: Diagnosis not present

## 2016-11-30 DIAGNOSIS — Z125 Encounter for screening for malignant neoplasm of prostate: Secondary | ICD-10-CM | POA: Diagnosis not present

## 2016-11-30 DIAGNOSIS — Z1389 Encounter for screening for other disorder: Secondary | ICD-10-CM | POA: Diagnosis not present

## 2016-11-30 DIAGNOSIS — Z0000 Encounter for general adult medical examination without abnormal findings: Secondary | ICD-10-CM | POA: Diagnosis not present

## 2016-11-30 DIAGNOSIS — Z136 Encounter for screening for cardiovascular disorders: Secondary | ICD-10-CM | POA: Diagnosis not present

## 2016-12-09 DIAGNOSIS — Z6826 Body mass index (BMI) 26.0-26.9, adult: Secondary | ICD-10-CM | POA: Diagnosis not present

## 2016-12-09 DIAGNOSIS — S301XXA Contusion of abdominal wall, initial encounter: Secondary | ICD-10-CM | POA: Diagnosis not present

## 2017-02-15 DIAGNOSIS — L578 Other skin changes due to chronic exposure to nonionizing radiation: Secondary | ICD-10-CM | POA: Diagnosis not present

## 2017-02-15 DIAGNOSIS — L821 Other seborrheic keratosis: Secondary | ICD-10-CM | POA: Diagnosis not present

## 2017-02-15 DIAGNOSIS — C44529 Squamous cell carcinoma of skin of other part of trunk: Secondary | ICD-10-CM | POA: Diagnosis not present

## 2017-02-25 DIAGNOSIS — M1612 Unilateral primary osteoarthritis, left hip: Secondary | ICD-10-CM | POA: Diagnosis not present

## 2017-02-28 DIAGNOSIS — Z01818 Encounter for other preprocedural examination: Secondary | ICD-10-CM | POA: Diagnosis not present

## 2017-02-28 DIAGNOSIS — H04552 Acquired stenosis of left nasolacrimal duct: Secondary | ICD-10-CM | POA: Diagnosis not present

## 2017-03-15 DIAGNOSIS — H04222 Epiphora due to insufficient drainage, left lacrimal gland: Secondary | ICD-10-CM | POA: Diagnosis not present

## 2017-03-15 DIAGNOSIS — H578 Other specified disorders of eye and adnexa: Secondary | ICD-10-CM | POA: Diagnosis not present

## 2017-03-15 DIAGNOSIS — H04552 Acquired stenosis of left nasolacrimal duct: Secondary | ICD-10-CM | POA: Diagnosis not present

## 2017-03-21 DIAGNOSIS — R1084 Generalized abdominal pain: Secondary | ICD-10-CM | POA: Diagnosis not present

## 2017-03-21 DIAGNOSIS — Z6826 Body mass index (BMI) 26.0-26.9, adult: Secondary | ICD-10-CM | POA: Diagnosis not present

## 2017-03-30 DIAGNOSIS — K219 Gastro-esophageal reflux disease without esophagitis: Secondary | ICD-10-CM | POA: Diagnosis not present

## 2017-03-30 DIAGNOSIS — J324 Chronic pansinusitis: Secondary | ICD-10-CM | POA: Diagnosis not present

## 2017-03-30 DIAGNOSIS — K591 Functional diarrhea: Secondary | ICD-10-CM | POA: Diagnosis not present

## 2017-03-30 DIAGNOSIS — J339 Nasal polyp, unspecified: Secondary | ICD-10-CM | POA: Diagnosis not present

## 2017-03-30 DIAGNOSIS — Z6826 Body mass index (BMI) 26.0-26.9, adult: Secondary | ICD-10-CM | POA: Diagnosis not present

## 2017-03-30 DIAGNOSIS — H04302 Unspecified dacryocystitis of left lacrimal passage: Secondary | ICD-10-CM | POA: Diagnosis not present

## 2017-04-07 ENCOUNTER — Encounter: Payer: Self-pay | Admitting: Allergy and Immunology

## 2017-04-07 ENCOUNTER — Ambulatory Visit (INDEPENDENT_AMBULATORY_CARE_PROVIDER_SITE_OTHER): Payer: Medicare Other | Admitting: Allergy and Immunology

## 2017-04-07 VITALS — BP 152/82 | HR 60 | Resp 18

## 2017-04-07 DIAGNOSIS — K219 Gastro-esophageal reflux disease without esophagitis: Secondary | ICD-10-CM

## 2017-04-07 DIAGNOSIS — J4540 Moderate persistent asthma, uncomplicated: Secondary | ICD-10-CM | POA: Diagnosis not present

## 2017-04-07 DIAGNOSIS — J3089 Other allergic rhinitis: Secondary | ICD-10-CM

## 2017-04-07 NOTE — Patient Instructions (Signed)
  1. Symbicort 160- 2 inhalations twice a day   2. Add Qvar 2 inhalations twice a day to Symbicort during "flareup"  3. Nasonex 1-2 sprays each nostril one time per day   4. Nexium 40 mg one tablet once a day. Can increase to twice a day during "flareup"  5. Ventolin HFA 2 puffs every 4-6 hours if needed   6. Over-the-counter nasal saline and antihistamine if needed   7. Prednisone 10 mg one time per day for the next 10 days  8. Return to clinic in 1 year or earlier if problem

## 2017-04-07 NOTE — Progress Notes (Signed)
Follow-up Note    Referring Provider: Nicoletta Dress, MD Primary Provider: Nicoletta Dress, MD Date of Office Visit: 04/07/2017  Subjective:   Kyle [name] (DOB: 12-30-1950) is a 66 y.o. male who returns to the [facility] on 04/07/2017 in re-evaluation of the following:  HPI: Kyle [name] returns to this clinic in reevaluation of his asthma and allergic rhinitis and history of chronic sinusitis and LPR. I have not seen him in this clinic since March 2018.  He was doing quite well while consistently using Symbicort and Nexium. 3 weeks ago he had tear duct surgery on his left side and he could not irrigate his upper airways. He was given Augmentin perioperatively but he had GI upset without significant diarrhea and he only consumed 5 days of this medication. Over the course of a week or 2 he started to develop significant mucus production and fullness for which he went to see Dr. Meredith Leeds, ENT, one week ago who performed a rhinoscopy and vacuumed out his mucus and recommended that he go on doxycycline as his culture was positive for staph. He did not use his antibiotic because he was feeling well and he went to a conference this weekend and he developed some issues with wheezing and coughing and some chest fullness and still has a tiny bit of head fullness. He started his Doxycycline last night. He does not have any fever and he does not have any chest pain and he has no other additional respiratory tract symptoms. He is able to irrigate at this point in time. He also started his Qvar for the past 2 days in conjunction with his Symbicort.  Allergies as of 04/07/2017      Reactions   Other    LEVAQUIN      Medication List      albuterol 108 (90 Base) MCG/ACT inhaler Commonly known as:  VENTOLIN HFA Inhale 2 puffs into the lungs every 4 (four) hours as needed for wheezing or shortness of breath.   beclomethasone 80 MCG/ACT inhaler Commonly known as:  QVAR  REDIHALER Inhale 2 Doses into the lungs 2 (two) times daily as needed (during asthma flare-up). Rinse, gargle, and spit after use.   budesonide-formoterol 160-4.5 MCG/ACT inhaler Commonly known as:  SYMBICORT Inhale two puffs twice a day to prevent cough or wheeze.  Rinse, gargle, and spit after use.   dicyclomine 20 MG tablet Commonly known as:  BENTYL Take 20 mg by mouth 4 (four) times daily.   esomeprazole 40 MG capsule Commonly known as:  NEXIUM Take 1 capsule (40 mg total) by mouth daily. Can increase to twice a day during flare-up.   mometasone 50 MCG/ACT nasal spray Commonly known as:  NASONEX Use 1-2 sprays in each nostril once daily.   zolpidem 10 MG tablet Commonly known as:  AMBIEN Take 10 mg by mouth at bedtime as needed. for sleep       Past Medical History:  Diagnosis Date  . Asthma   . Reflux   . Sinusitis     Past Surgical History:  Procedure Laterality Date  . KNEE ARTHROPLASTY    . SINOSCOPY     6 SURGERIES    Review of systems negative except as noted in HPI / PMHx or noted below:  Review of Systems  Constitutional: Negative.   HENT: Negative.   Eyes: Negative.   Respiratory: Negative.   Cardiovascular: Negative.   Gastrointestinal: Negative.   Genitourinary: Negative.  Musculoskeletal: Negative.   Skin: Negative.   Neurological: Negative.   Endo/Heme/Allergies: Negative.   Psychiatric/Behavioral: Negative.      Objective:   Vitals:   04/07/17 1357  BP: (!) 152/82  Pulse: 60  Resp: 18          Physical Exam  Constitutional: He is well-developed, well-nourished, and in no distress.  HENT:  Head: Normocephalic.  Right Ear: Tympanic membrane, external ear and ear canal normal.  Left Ear: Tympanic membrane, external ear and ear canal normal.  Nose: Nose normal. No mucosal edema or rhinorrhea.  Mouth/Throat: Uvula is midline, oropharynx is clear and moist and mucous membranes are normal. No oropharyngeal exudate.  Eyes:  Conjunctivae are normal.  Neck: Trachea normal. No tracheal tenderness present. No tracheal deviation present. No thyromegaly present.  Cardiovascular: Normal rate, regular rhythm, S1 normal, S2 normal and normal heart sounds.   No murmur heard. Pulmonary/Chest: Breath sounds normal. No stridor. No respiratory distress. He has no wheezes. He has no rales.  Musculoskeletal: He exhibits no edema.  Lymphadenopathy:       Head (right side): No tonsillar adenopathy present.       Head (left side): No tonsillar adenopathy present.    He has no cervical adenopathy.  Neurological: He is alert. Gait normal.  Skin: No rash noted. He is not diaphoretic. No erythema. Nails show no clubbing.  Psychiatric: Mood and affect normal.    Diagnostics:    Spirometry was performed and demonstrated an FEV1 of 3.08 at 84 % of predicted.  Assessment and Plan:   1. Not well controlled moderate persistent asthma   2. Other allergic rhinitis   3. LPRD (laryngopharyngeal reflux disease)     1. Symbicort 160- 2 inhalations twice a day   2. Add Qvar 2 inhalations twice a day to Symbicort during "flareup"  3. Nasonex 1-2 sprays each nostril one time per day   4. Nexium 40 mg one tablet once a day. Can increase to twice a day during "flareup"  5. Ventolin HFA 2 puffs every 4-6 hours if needed   6. Over-the-counter nasal saline and antihistamine if needed   7. Prednisone 10 mg one time per day for the next 10 days  8. Return to clinic in 1 year or earlier if problem   Kyle [name] appears to have some inflammation of his airway and I will give him a relatively low dose systemic steroid and if he is better in 5 days then he can discontinue the steroid at that point in time. He will continue to use anti-inflammatory agents for both his upper and lower airways as noted above as well as addressing the issue with his reflux. I will see him back in this clinic in 1 year or earlier if there is a problem.  Allena Katz,  MD Allergy / Immunology [place]

## 2017-04-18 DIAGNOSIS — Z6826 Body mass index (BMI) 26.0-26.9, adult: Secondary | ICD-10-CM | POA: Diagnosis not present

## 2017-04-18 DIAGNOSIS — J339 Nasal polyp, unspecified: Secondary | ICD-10-CM | POA: Diagnosis not present

## 2017-04-18 DIAGNOSIS — H6982 Other specified disorders of Eustachian tube, left ear: Secondary | ICD-10-CM | POA: Diagnosis not present

## 2017-04-18 DIAGNOSIS — H90A32 Mixed conductive and sensorineural hearing loss, unilateral, left ear with restricted hearing on the contralateral side: Secondary | ICD-10-CM | POA: Diagnosis not present

## 2017-04-18 DIAGNOSIS — J324 Chronic pansinusitis: Secondary | ICD-10-CM | POA: Diagnosis not present

## 2017-04-18 DIAGNOSIS — R42 Dizziness and giddiness: Secondary | ICD-10-CM | POA: Diagnosis not present

## 2017-04-18 DIAGNOSIS — H04302 Unspecified dacryocystitis of left lacrimal passage: Secondary | ICD-10-CM | POA: Diagnosis not present

## 2017-04-19 DIAGNOSIS — N486 Induration penis plastica: Secondary | ICD-10-CM | POA: Diagnosis not present

## 2017-04-19 DIAGNOSIS — N401 Enlarged prostate with lower urinary tract symptoms: Secondary | ICD-10-CM | POA: Diagnosis not present

## 2017-04-19 DIAGNOSIS — R972 Elevated prostate specific antigen [PSA]: Secondary | ICD-10-CM | POA: Diagnosis not present

## 2017-04-19 DIAGNOSIS — R351 Nocturia: Secondary | ICD-10-CM | POA: Diagnosis not present

## 2017-04-26 DIAGNOSIS — F40240 Claustrophobia: Secondary | ICD-10-CM | POA: Diagnosis not present

## 2017-04-26 DIAGNOSIS — R51 Headache: Secondary | ICD-10-CM | POA: Diagnosis not present

## 2017-04-26 DIAGNOSIS — R200 Anesthesia of skin: Secondary | ICD-10-CM | POA: Diagnosis not present

## 2017-04-26 DIAGNOSIS — M47812 Spondylosis without myelopathy or radiculopathy, cervical region: Secondary | ICD-10-CM | POA: Diagnosis not present

## 2017-04-26 DIAGNOSIS — M5412 Radiculopathy, cervical region: Secondary | ICD-10-CM | POA: Diagnosis not present

## 2017-04-26 DIAGNOSIS — H538 Other visual disturbances: Secondary | ICD-10-CM | POA: Diagnosis not present

## 2017-04-27 DIAGNOSIS — R51 Headache: Secondary | ICD-10-CM | POA: Diagnosis not present

## 2017-04-27 DIAGNOSIS — J329 Chronic sinusitis, unspecified: Secondary | ICD-10-CM | POA: Diagnosis not present

## 2017-05-09 ENCOUNTER — Telehealth: Payer: Self-pay | Admitting: Allergy and Immunology

## 2017-05-09 MED ORDER — FLUCONAZOLE 100 MG PO TABS: 100.0000 mg | ORAL_TABLET | Freq: Every day | ORAL | 0 refills | Status: DC

## 2017-05-09 NOTE — Telephone Encounter (Signed)
Kyle [name] walked in and stated that after use of his inhaler he noticed he has thrush.  He would like something called in to take care of it.  He would like it called in to [facility].

## 2017-05-09 NOTE — Telephone Encounter (Signed)
L/M for patient to contact office. Rx sent to [facility]

## 2017-05-09 NOTE — Telephone Encounter (Signed)
Please send in Diflucan 100mg  daily x 7 days.   He should let us know if he continues to have thrust past the 7 day treatment.

## 2017-05-10 NOTE — Telephone Encounter (Signed)
Patient advised of RX

## 2017-05-18 DIAGNOSIS — R42 Dizziness and giddiness: Secondary | ICD-10-CM | POA: Diagnosis not present

## 2017-05-18 DIAGNOSIS — H903 Sensorineural hearing loss, bilateral: Secondary | ICD-10-CM | POA: Diagnosis not present

## 2017-05-30 DIAGNOSIS — M542 Cervicalgia: Secondary | ICD-10-CM | POA: Diagnosis not present

## 2017-05-31 DIAGNOSIS — R202 Paresthesia of skin: Secondary | ICD-10-CM | POA: Diagnosis not present

## 2017-05-31 DIAGNOSIS — M542 Cervicalgia: Secondary | ICD-10-CM | POA: Diagnosis not present

## 2017-06-02 DIAGNOSIS — M542 Cervicalgia: Secondary | ICD-10-CM | POA: Diagnosis not present

## 2017-06-02 DIAGNOSIS — R202 Paresthesia of skin: Secondary | ICD-10-CM | POA: Diagnosis not present

## 2017-06-06 DIAGNOSIS — R202 Paresthesia of skin: Secondary | ICD-10-CM | POA: Diagnosis not present

## 2017-06-06 DIAGNOSIS — M542 Cervicalgia: Secondary | ICD-10-CM | POA: Diagnosis not present

## 2017-06-08 DIAGNOSIS — J324 Chronic pansinusitis: Secondary | ICD-10-CM

## 2017-06-08 DIAGNOSIS — J0120 Acute ethmoidal sinusitis, unspecified: Secondary | ICD-10-CM | POA: Diagnosis not present

## 2017-06-08 HISTORY — DX: Chronic pansinusitis: J32.4

## 2017-06-09 DIAGNOSIS — M542 Cervicalgia: Secondary | ICD-10-CM | POA: Diagnosis not present

## 2017-06-09 DIAGNOSIS — R202 Paresthesia of skin: Secondary | ICD-10-CM | POA: Diagnosis not present

## 2017-06-13 DIAGNOSIS — M542 Cervicalgia: Secondary | ICD-10-CM | POA: Diagnosis not present

## 2017-06-13 DIAGNOSIS — R202 Paresthesia of skin: Secondary | ICD-10-CM | POA: Diagnosis not present

## 2017-06-15 DIAGNOSIS — H2513 Age-related nuclear cataract, bilateral: Secondary | ICD-10-CM | POA: Diagnosis not present

## 2017-06-15 DIAGNOSIS — H524 Presbyopia: Secondary | ICD-10-CM | POA: Diagnosis not present

## 2017-06-20 DIAGNOSIS — H818X2 Other disorders of vestibular function, left ear: Secondary | ICD-10-CM | POA: Diagnosis not present

## 2017-06-21 DIAGNOSIS — R202 Paresthesia of skin: Secondary | ICD-10-CM | POA: Diagnosis not present

## 2017-06-21 DIAGNOSIS — M542 Cervicalgia: Secondary | ICD-10-CM | POA: Diagnosis not present

## 2017-06-28 DIAGNOSIS — R202 Paresthesia of skin: Secondary | ICD-10-CM | POA: Diagnosis not present

## 2017-06-28 DIAGNOSIS — M542 Cervicalgia: Secondary | ICD-10-CM | POA: Diagnosis not present

## 2017-06-30 DIAGNOSIS — R202 Paresthesia of skin: Secondary | ICD-10-CM | POA: Diagnosis not present

## 2017-06-30 DIAGNOSIS — M542 Cervicalgia: Secondary | ICD-10-CM | POA: Diagnosis not present

## 2017-07-04 DIAGNOSIS — M542 Cervicalgia: Secondary | ICD-10-CM | POA: Diagnosis not present

## 2017-07-04 DIAGNOSIS — R202 Paresthesia of skin: Secondary | ICD-10-CM | POA: Diagnosis not present

## 2017-07-06 DIAGNOSIS — Z23 Encounter for immunization: Secondary | ICD-10-CM | POA: Diagnosis not present

## 2017-07-06 DIAGNOSIS — M542 Cervicalgia: Secondary | ICD-10-CM | POA: Diagnosis not present

## 2017-07-06 DIAGNOSIS — R202 Paresthesia of skin: Secondary | ICD-10-CM | POA: Diagnosis not present

## 2017-07-07 DIAGNOSIS — G44019 Episodic cluster headache, not intractable: Secondary | ICD-10-CM | POA: Diagnosis not present

## 2017-07-07 DIAGNOSIS — Z6827 Body mass index (BMI) 27.0-27.9, adult: Secondary | ICD-10-CM | POA: Diagnosis not present

## 2017-07-11 ENCOUNTER — Ambulatory Visit (INDEPENDENT_AMBULATORY_CARE_PROVIDER_SITE_OTHER): Payer: Medicare Other | Admitting: Neurology

## 2017-07-11 ENCOUNTER — Encounter: Payer: Self-pay | Admitting: Neurology

## 2017-07-11 ENCOUNTER — Encounter (INDEPENDENT_AMBULATORY_CARE_PROVIDER_SITE_OTHER): Payer: Self-pay

## 2017-07-11 DIAGNOSIS — L700 Acne vulgaris: Secondary | ICD-10-CM | POA: Diagnosis not present

## 2017-07-11 DIAGNOSIS — R51 Headache: Secondary | ICD-10-CM

## 2017-07-11 DIAGNOSIS — G44019 Episodic cluster headache, not intractable: Secondary | ICD-10-CM | POA: Diagnosis not present

## 2017-07-11 DIAGNOSIS — R799 Abnormal finding of blood chemistry, unspecified: Secondary | ICD-10-CM | POA: Diagnosis not present

## 2017-07-11 DIAGNOSIS — L820 Inflamed seborrheic keratosis: Secondary | ICD-10-CM | POA: Diagnosis not present

## 2017-07-11 DIAGNOSIS — G7000 Myasthenia gravis without (acute) exacerbation: Secondary | ICD-10-CM | POA: Diagnosis not present

## 2017-07-11 DIAGNOSIS — R519 Headache, unspecified: Secondary | ICD-10-CM | POA: Insufficient documentation

## 2017-07-11 DIAGNOSIS — R202 Paresthesia of skin: Secondary | ICD-10-CM

## 2017-07-11 HISTORY — DX: Paresthesia of skin: R20.2

## 2017-07-11 HISTORY — DX: Headache, unspecified: R51.9

## 2017-07-11 NOTE — Addendum Note (Signed)
Addended by: Marcial Pacas on: 07/11/2017 02:01 PM   Modules accepted: Orders

## 2017-07-11 NOTE — Progress Notes (Signed)
PATIENT: Kyle [name] DOB: 21-Jun-1951  Chief Complaint  Patient presents with  . Headache    Reports having three episodes of cluster headaches in the past. Symptoms include numbness/tingling in face and pain in teeth.  Kyle [name] gave him Imitrex which was helpful.  . [name]    Kyle [name]., MD - referring MD  . [name]    Kyle Dress, MD     HISTORICAL  Kyle [name] is a 66 year old right male, seen in refer by Kyle [name]  Kyle [name] for evaluation of headaches, Kyle primary care is Dr.,Schultz, Kyle [name], initial evaluation was on July 11 2017  He Had past medical history of chronic sinusitis, had sinus surgery in the past, past medical history of acid reflux, hip and knee surgery, left tear duct surgery  He denied previous history of headache, since June 2018, he had intermittent left facial numbness, spreading to left arm, lightheadedness, unsteadiness sensation, no vertigo, per patient, he had [name] vestibular evaluation, was told of decreased left vestibular function, but with preserved hearing at the left side,  He denies visual loss, he also complains of intermittent double vision, feeling fatigued,  He reported a history of cluster headache, left side retro-orbital area iced piercing headaches, lasting 20 minutes, happen every night, with associated tearing, stuffy nose. Kyle headache responding well to Imitrex 50 mg as needed  I personally reviewed MRI of the brain without contrast in August 2018, mild generalized atrophy, evidence of severe chronic sinusitis  REVIEW OF SYSTEMS: Full 14 system review of systems performed and notable only for fatigue, eye pain, spinning sensation, cramps, headache, numbness, dizziness  ALLERGIES: Allergies  Allergen Reactions  . Other     LEVAQUIN    HOME MEDICATIONS: Current Outpatient Prescriptions  Medication Sig Dispense Refill  . albuterol (VENTOLIN HFA) 108 (90 Base) MCG/ACT inhaler Inhale 2 puffs into  the lungs every 4 (four) hours as needed for wheezing or shortness of breath. 3 Inhaler 1  . Beclomethasone Diprop HFA (QVAR REDIHALER) 80 MCG/ACT AERB Inhale 2 Doses into the lungs 2 (two) times daily as needed (during asthma flare-up). Rinse, gargle, and spit after use. 3 Inhaler 3  . budesonide-formoterol (SYMBICORT) 160-4.5 MCG/ACT inhaler Inhale two puffs twice a day to prevent cough or wheeze.  Rinse, gargle, and spit after use. 3 Inhaler 3  . esomeprazole (NEXIUM) 40 MG capsule Take 1 capsule (40 mg total) by mouth daily. Can increase to twice a day during flare-up. 180 capsule 3  . mometasone (NASONEX) 50 MCG/ACT nasal spray Use 1-2 sprays in each nostril once daily. 51 g 3   No current facility-administered medications for this visit.     PAST MEDICAL HISTORY: Past Medical History:  Diagnosis Date  . Asthma   . Cluster headache   . Reflux   . Sinusitis     PAST SURGICAL HISTORY: Past Surgical History:  Procedure Laterality Date  . bone spur removed    . KNEE ARTHROPLASTY     x 2  . SINOSCOPY     6 SURGERIES  . tear duct replacement      FAMILY HISTORY: Family History  Problem Relation Age of Onset  . Emphysema Mother   . Heart attack Mother   . Asthma Father   . Stroke Father     SOCIAL HISTORY:  Social History   Social History  . Marital status: Married    Spouse name: N/A  . Number of children: 2  .  Years of education: Masters   Occupational History  . Production assistant, radio    Social History Main Topics  . Smoking status: Never Smoker  . Smokeless tobacco: Never Used  . Alcohol use No  . Drug use: No  . Sexual activity: Not on file   Other Topics Concern  . Not on file   Social History Narrative   Lives at home with Kyle wife.   Right-handed.   2.5 - 3 cups coffee per day.     PHYSICAL EXAM   Vitals:   07/11/17 0823  BP: (!) 162/89  Pulse: 67  Weight: 211 lb (95.7 kg)  Height: 6' 0.05" (1.83 m)    Not recorded      Body mass  index is 28.58 kg/m.  PHYSICAL EXAMNIATION:  Gen: NAD, conversant, well nourised, obese, well groomed                     Cardiovascular: Regular rate rhythm, no peripheral edema, warm, nontender. Eyes: Conjunctivae clear without exudates or hemorrhage Neck: Supple, no carotid bruits. Pulmonary: Clear to auscultation bilaterally   NEUROLOGICAL EXAM:  MENTAL STATUS: Frequent eye brow movements Speech:    Speech is normal; fluent and spontaneous with normal comprehension.  Cognition:     Orientation to time, place and person     Normal recent and remote memory     Normal Attention span and concentration     Normal Language, naming, repeating,spontaneous speech     Fund of knowledge   CRANIAL NERVES: CN II: Visual fields are full to confrontation. Fundoscopic exam is normal with sharp discs and no vascular changes. Pupils are round equal and briskly reactive to light. CN III, IV, VI: extraocular movement are normal. No ptosis. CN V: Facial sensation is intact to pinprick in all 3 divisions bilaterally. Corneal responses are intact.  CN VII: Face is symmetric with normal eye closure and smile. CN VIII: Hearing is normal to rubbing fingers CN IX, X: Palate elevates symmetrically. Phonation is normal. CN XI: Head turning and shoulder shrug are intact CN XII: Tongue is midline with normal movements and no atrophy.  MOTOR: There is no pronator drift of out-stretched arms. Muscle bulk and tone are normal. Muscle strength is normal.  REFLEXES: Reflexes are 2+ and symmetric at the biceps, triceps, knees, and ankles. Plantar responses are flexor.  SENSORY: Intact to light touch, pinprick, positional sensation and vibratory sensation are intact in fingers and toes.  COORDINATION: Rapid alternating movements and fine finger movements are intact. There is no dysmetria on finger-to-nose and heel-knee-shin.    GAIT/STANCE: Posture is normal. Gait is steady with normal steps, base, arm  swing, and turning. Heel and toe walking are normal. Tandem gait is normal.  Romberg is absent.   DIAGNOSTIC DATA (LABS, IMAGING, TESTING) - I reviewed patient records, labs, notes, testing and imaging myself where available.   ASSESSMENT AND PLAN  Kyle [name] is a 66 y.o. male   Cluster headache  Responding well to Imitrex 50 mg as needed Intermittent left facial, left hand paresthesia, intermittent diplopia, fatigue  Normal MRI of the brain, normal neurological examinations,  Laboratory evaluations, including acetylcholine receptor antibody   Kyle [name], M.D. Ph.D.  [facility] [address] [place], [place] [address] Ph: [phone] Fax: [phone]  CC: Kyle [name]., MD, Kyle Dress, MD

## 2017-07-12 LAB — CBC
Hematocrit: 45.2 % (ref 37.5–51.0)
Hemoglobin: 15.1 g/dL (ref 13.0–17.7)
MCH: 30.6 pg (ref 26.6–33.0)
MCHC: 33.4 g/dL (ref 31.5–35.7)
MCV: 92 fL (ref 79–97)
PLATELETS: 224 10*3/uL (ref 150–379)
RBC: 4.94 x10E6/uL (ref 4.14–5.80)
RDW: 13.9 % (ref 12.3–15.4)
WBC: 5.1 10*3/uL (ref 3.4–10.8)

## 2017-07-12 LAB — LIPID PANEL
CHOLESTEROL TOTAL: 195 mg/dL (ref 100–199)
Chol/HDL Ratio: 3 ratio (ref 0.0–5.0)
HDL: 65 mg/dL (ref >39)
LDL Calculated: 119 mg/dL — ABNORMAL HIGH (ref 0–99)
TRIGLYCERIDES: 57 mg/dL (ref 0–149)
VLDL CHOLESTEROL CAL: 11 mg/dL (ref 5–40)

## 2017-07-12 LAB — COMPREHENSIVE METABOLIC PANEL
A/G RATIO: 2.1 (ref 1.2–2.2)
ALT: 13 IU/L (ref 0–44)
AST: 16 IU/L (ref 0–40)
Albumin: 4.5 g/dL (ref 3.6–4.8)
Alkaline Phosphatase: 70 IU/L (ref 39–117)
BILIRUBIN TOTAL: 0.4 mg/dL (ref 0.0–1.2)
BUN/Creatinine Ratio: 17 (ref 10–24)
BUN: 20 mg/dL (ref 8–27)
CHLORIDE: 106 mmol/L (ref 96–106)
CO2: 24 mmol/L (ref 20–29)
Calcium: 9.4 mg/dL (ref 8.6–10.2)
Creatinine, Ser: 1.19 mg/dL (ref 0.76–1.27)
GFR calc Af Amer: 73 mL/min/{1.73_m2} (ref >59)
GFR calc non Af Amer: 63 mL/min/{1.73_m2} (ref >59)
GLUCOSE: 99 mg/dL (ref 65–99)
Globulin, Total: 2.1 g/dL (ref 1.5–4.5)
POTASSIUM: 5 mmol/L (ref 3.5–5.2)
Sodium: 146 mmol/L — ABNORMAL HIGH (ref 134–144)
TOTAL PROTEIN: 6.6 g/dL (ref 6.0–8.5)

## 2017-07-12 LAB — SEDIMENTATION RATE: Sed Rate: 2 mm/hr (ref 0–30)

## 2017-07-12 LAB — CK: CK TOTAL: 124 U/L (ref 24–204)

## 2017-07-12 LAB — VITAMIN B12: VITAMIN B 12: 586 pg/mL (ref 232–1245)

## 2017-07-12 LAB — C-REACTIVE PROTEIN: CRP: 2.6 mg/L (ref 0.0–4.9)

## 2017-07-12 LAB — TSH: TSH: 2.49 u[IU]/mL (ref 0.450–4.500)

## 2017-07-12 LAB — ANA W/REFLEX: ANA: NEGATIVE

## 2017-07-12 LAB — RPR: RPR Ser Ql: NONREACTIVE

## 2017-07-14 ENCOUNTER — Telehealth: Payer: Self-pay | Admitting: Neurology

## 2017-07-14 NOTE — Telephone Encounter (Signed)
Please call patient, lab showed mildly elevated LDL 119, he should manage it by exercise, diet. Rest of  laboratory evaluations showed no significant abnormality

## 2017-07-14 NOTE — Telephone Encounter (Addendum)
Spoke to his wife on HIPAA - she wrote the results and recommendations down to share the information with her husband.

## 2017-07-19 DIAGNOSIS — R42 Dizziness and giddiness: Secondary | ICD-10-CM | POA: Diagnosis not present

## 2017-07-19 DIAGNOSIS — H6981 Other specified disorders of Eustachian tube, right ear: Secondary | ICD-10-CM | POA: Diagnosis not present

## 2017-07-19 DIAGNOSIS — H6501 Acute serous otitis media, right ear: Secondary | ICD-10-CM | POA: Diagnosis not present

## 2017-07-19 DIAGNOSIS — J324 Chronic pansinusitis: Secondary | ICD-10-CM | POA: Diagnosis not present

## 2017-07-25 DIAGNOSIS — H81392 Other peripheral vertigo, left ear: Secondary | ICD-10-CM | POA: Diagnosis not present

## 2017-07-25 DIAGNOSIS — R2689 Other abnormalities of gait and mobility: Secondary | ICD-10-CM | POA: Diagnosis not present

## 2017-07-26 LAB — SPECIMEN STATUS REPORT

## 2017-08-01 DIAGNOSIS — R2689 Other abnormalities of gait and mobility: Secondary | ICD-10-CM | POA: Diagnosis not present

## 2017-08-01 DIAGNOSIS — H81392 Other peripheral vertigo, left ear: Secondary | ICD-10-CM | POA: Diagnosis not present

## 2017-08-03 LAB — ACETYLCHOLINE RECEPTOR AB, ALL
AChR Binding Ab, Serum: <0.03 nmol/L (ref 0.00–0.24)
Acetylchol Block Ab: 21 % (ref 0–25)
Acetylcholine Modulat Ab: <12 % (ref 0–20)

## 2017-08-03 LAB — SPECIMEN STATUS REPORT

## 2017-08-17 DIAGNOSIS — M1712 Unilateral primary osteoarthritis, left knee: Secondary | ICD-10-CM | POA: Diagnosis not present

## 2017-08-17 DIAGNOSIS — M1990 Unspecified osteoarthritis, unspecified site: Secondary | ICD-10-CM | POA: Diagnosis not present

## 2017-08-23 ENCOUNTER — Telehealth: Payer: Self-pay | Admitting: *Deleted

## 2017-08-23 DIAGNOSIS — J069 Acute upper respiratory infection, unspecified: Secondary | ICD-10-CM | POA: Diagnosis not present

## 2017-08-23 NOTE — Telephone Encounter (Signed)
Patient advised cough sx for 2 days and getting worse. Using Symbicort and Qvar as directed. No sinus symptoms at this time. Wanting Rx called in and refill rescue inhaler his is out of date and doesn't want to use it

## 2017-08-23 NOTE — Telephone Encounter (Signed)
Patient informed, he will come by to pick up Prednisone.

## 2017-08-23 NOTE — Telephone Encounter (Signed)
Please inform patient that he can use prednisone 10mg  two tablets one time per day for three days only. See [place] in [place] if not doing well.

## 2017-08-30 ENCOUNTER — Encounter: Payer: Self-pay | Admitting: Family Medicine

## 2017-08-30 ENCOUNTER — Ambulatory Visit (INDEPENDENT_AMBULATORY_CARE_PROVIDER_SITE_OTHER): Payer: Medicare Other | Admitting: Family Medicine

## 2017-08-30 VITALS — BP 172/92 | HR 76 | Resp 16

## 2017-08-30 DIAGNOSIS — H1010 Acute atopic conjunctivitis, unspecified eye: Secondary | ICD-10-CM | POA: Insufficient documentation

## 2017-08-30 DIAGNOSIS — J4541 Moderate persistent asthma with (acute) exacerbation: Secondary | ICD-10-CM

## 2017-08-30 DIAGNOSIS — K219 Gastro-esophageal reflux disease without esophagitis: Secondary | ICD-10-CM | POA: Insufficient documentation

## 2017-08-30 DIAGNOSIS — J309 Allergic rhinitis, unspecified: Secondary | ICD-10-CM | POA: Diagnosis not present

## 2017-08-30 HISTORY — DX: Allergic rhinitis, unspecified: H10.10

## 2017-08-30 HISTORY — DX: Gastro-esophageal reflux disease without esophagitis: K21.9

## 2017-08-30 MED ORDER — GUAIFENESIN ER 600 MG PO TB12: 600.0000 mg | ORAL_TABLET | Freq: Two times a day (BID) | ORAL | 2 refills | Status: DC

## 2017-08-30 NOTE — Patient Instructions (Addendum)
  1. Symbicort 160- 2 inhalations twice a day   2. Add Qvar 2 inhalations twice a day to Symbicort during "flareup"  3. Nasonex 2 sprays each nostril one time per day during your flare up. May decrease to 1 spray when back to baseline  4. Nexium 40 mg one tablet once a day. Can increase to twice a day during "flareup"  5. Ventolin HFA 2 puffs every 4-6 hours if needed   6. Over-the-counter nasal saline nasal wash daily  7. Daily antihistamine during flare up (Zyzal samples provided)  8. Return to clinic in 2 months or earlier if problem

## 2017-08-30 NOTE — Progress Notes (Signed)
[address] [place] [address] Dept: [phone]  FAMILY NURSE PRACTITIONER FOLLOW UP NOTE  Patient ID: Kyle [name], male    DOB: 05/16/1951  Age: 66 y.o. MRN: [id number] Date of Office Visit: 08/30/2017  Assessment  Chief Complaint: Asthma  HPI [name] is a 66 year old male who presents to the clinic today for evaluation of shortness of breath and wheeze which began about 8 days ago. He was last seen in this clinic on 04/07/2017 by Dr. Neldon Mc in evaluation of asthma, allergic rhinitis, chronic sinusitis, and LPR. At that time, he was experiencing some inflammation of his airway and he received a low dose oral prednisone for 5 days and was continued on his anti-inflammatory medications with full resolution of his symptoms. Since that visit, Kyle [name] has had an acute sinusitis, acute dacroycytosis of the left lacrimal sac, and right acute serous otitis media, each requiring an antibiotic for resolution. He does have chronic pansinusitis and has had multiple sinus surgeries in the past.   At today's visit, Kyle [name] reports he began to have shortness of breath, wheezing, and increased throat clearing after he shoveled snow about 8 days ago. He reports his readings on his peak flow meter have been decreased for the first two exhalations and increase on the third exhalation. Kyle [name] began prednisone that was called in from this office,20 mg for 3 days, on Tuesday 08/23/2017 for cough. He also reports that he saw Dr. Delena Bali one week ago and received a kenalog injection, doxycycline 100 mg BID for 10 days, and a steroid taper dose. Kyle [name] reports he started the steroid immediately but did not begin the doxycycline until Friday (4 days ago). He reports his sinuses are feeling fine today and he has not had a fever. He denies chest pain, abdominal pain, body aches, and diarrhea. He is currently using Symbicort 160- 2 puffs twice a day with a spacer, Qvar 1 puff twice a day, Nasonex 1 spray  in each nostril 1 time a day, and esomeprazole 40 mg a day.    Drug Allergies:  Allergies  Allergen Reactions  . Levaquin [Levofloxacin In D5w]     Physical Exam: BP (!) 172/92   Pulse 76   Resp 16    Physical Exam  Constitutional: He is oriented to person, place, and time. He appears well-developed and well-nourished.  HENT:  Head: Normocephalic.  Right Ear: External ear normal.  Left Ear: External ear normal.  Bilateral nares with slight erythema noted and no drainage. Eyes normal. Pharynx slightly erythematous without exudate noted.  Eyes: Conjunctivae are normal.  Neck: Normal range of motion. Neck supple.  Cardiovascular: Normal rate, regular rhythm and normal heart sounds.  S1S2 normal. Regular heart rate and rhythm. No murmur noted  Pulmonary/Chest: Effort normal and breath sounds normal.  Lungs clear to auscultation  Musculoskeletal: Normal range of motion.  Neurological: He is alert and oriented to person, place, and time.  Skin: Skin is warm and dry.  Psychiatric: He has a normal mood and affect. His behavior is normal. Judgment and thought content normal.    Diagnostics: FEV1 2.82/76% of predicted.  FVC 4.55/ 91% of predicted. No change in post spirometry after bronchodilator administration. Mile airway obstruction.     Assessment and Plan: 1. LPRD (laryngopharyngeal reflux disease)   2. Asthma, not well controlled, moderate persistent, with acute exacerbation   3. Allergic rhinoconjunctivitis     Meds ordered this encounter  Medications  . DISCONTD: guaiFENesin (Ruth) 600  MG 12 hr tablet    Sig: Take 1 tablet (600 mg total) by mouth 2 (two) times daily.    Dispense:  30 tablet    Refill:  2    Patient Instructions   1. Symbicort 160- 2 inhalations twice a day   2. Add Qvar 2 inhalations twice a day to Symbicort during "flareup"  3. Nasonex 2 sprays each nostril one time per day during your flare up. May decrease to 1 spray when back to  baseline  4. Nexium 40 mg one tablet once a day. Can increase to twice a day during "flareup"  5. Ventolin HFA 2 puffs every 4-6 hours if needed   6. Over-the-counter nasal saline nasal wash daily  7. Daily antihistamine during flare up (Zyzal samples provided)  8. Return to clinic in 2 months or earlier if problem     Return in about 2 months (around 10/31/2017), or if symptoms worsen or fail to improve.   Kyle [name] appears to have some inflammation of his airways at this time. At today's visit he is on day 5 out of 10 days of Doxycycline and is finishing a prednisone taper dose today. I have increased his Qvar to 2 puffs twice a day, increased his Nasonex nasal spray, and given him samples of an antihistamine, all to use until this flare is more controlled. I will see Kyle [name] back in this office in 2 months to evaluate progress and decrease medications as possible.   Thank you for the opportunity to care for this patient.  Please do not hesitate to contact me with questions.  Gareth Morgan, FNP [facility]

## 2017-09-02 ENCOUNTER — Telehealth: Payer: Self-pay | Admitting: *Deleted

## 2017-09-02 MED ORDER — FLUCONAZOLE 100 MG PO TABS: 100.0000 mg | ORAL_TABLET | Freq: Every day | ORAL | 0 refills | Status: DC

## 2017-09-02 NOTE — Telephone Encounter (Signed)
Patient called and advised seen by Gareth Morgan, NP on Tuesday and was instructed to increase his Qvar for his asthma. He advised he is doing better but now has some thrush even though he is rinsing after use.  Per Dr Neldon Mc go ahead and send same Diflucan Rx we have sent in past. Sent Diflucan 100mg  daily for 7 days to [facility]

## 2017-09-16 DIAGNOSIS — M1712 Unilateral primary osteoarthritis, left knee: Secondary | ICD-10-CM | POA: Diagnosis not present

## 2017-09-23 ENCOUNTER — Encounter: Payer: Self-pay | Admitting: Family Medicine

## 2017-09-23 ENCOUNTER — Ambulatory Visit (INDEPENDENT_AMBULATORY_CARE_PROVIDER_SITE_OTHER): Payer: Medicare Other | Admitting: Family Medicine

## 2017-09-23 VITALS — BP 172/90 | HR 72 | Resp 16

## 2017-09-23 DIAGNOSIS — J309 Allergic rhinitis, unspecified: Secondary | ICD-10-CM

## 2017-09-23 DIAGNOSIS — H1010 Acute atopic conjunctivitis, unspecified eye: Secondary | ICD-10-CM

## 2017-09-23 DIAGNOSIS — J4541 Moderate persistent asthma with (acute) exacerbation: Secondary | ICD-10-CM

## 2017-09-23 DIAGNOSIS — K219 Gastro-esophageal reflux disease without esophagitis: Secondary | ICD-10-CM | POA: Diagnosis not present

## 2017-09-23 MED ORDER — IPRATROPIUM-ALBUTEROL 0.5-2.5 (3) MG/3ML IN SOLN: 3.0000 mL | RESPIRATORY_TRACT | Status: AC

## 2017-09-23 MED ORDER — FLUCONAZOLE 150 MG PO TABS: ORAL_TABLET | ORAL | 0 refills | Status: DC

## 2017-09-23 MED ORDER — ALBUTEROL SULFATE HFA 108 (90 BASE) MCG/ACT IN AERS: 2.0000 | INHALATION_SPRAY | RESPIRATORY_TRACT | 1 refills | Status: DC | PRN

## 2017-09-23 NOTE — Progress Notes (Signed)
[address] [place] [address] Dept: [phone]  FAMILY NURSE PRACTITIONER FOLLOW UP NOTE  Patient ID: Kyle [name], male    DOB: 02-06-1951  Age: 67 y.o. MRN: [id number] Date of Office Visit: 09/23/2017  Assessment  Chief Complaint: throat clearing and Shortness of Breath  HPI Kyle [name] is a 67 year old male who presents to the clinic today for a sick visit. He was last seen in this clinic on 08/30/2017 by Gareth Morgan, NP, for evaluation of moderate persistent asthma with exacerbation, allergic rhinitis, and LPR.  At that visit, Kyle [name] was reporting shortness of breath, wheeze, and increased throat clearing.  At that visit, his Qvar was increased to 2 puffs twice a day, Nasonex nasal spray was increased and he started on a sample of Xyzal.  Prior to that visit, he had been on 2 rounds of prednisone as well as Kenalog injection, and doxycycline 100 mg twice a day for 10 days.  He does have a history of chronic pansinusitis and has had multiple sinus surgeries in the past.  At today's visit, Kyle [name] is reporting he feels  like there is a "band around my throat while I am breathing ".  He denies shortness of breath, cough, wheezing, and fever.  He does report feeling some shortness of breath with strenuous activity.  He measures peak flow volumes every day which he reports as starting with a low reading with the first blow and then becoming normal with subsequent blows.  He is currently using Symbicort 160- 2 puffs in the morning 2 puffs in the evening and Qvar 80-2 puffs in the morning and 2 puffs in the evening.  He has not used his rescue inhaler since before his last visit here.  Allergic rhinitis is reported as well controlled and he denies nasal congestion or runny nose.  He continues to use Nasacort once in the morning and once in the evening as well as Xyzol 5 mg once a day.  Esophageal reflux is reported as moderately well controlled.  He reports having heartburn  about 1 week ago when he ate junk food shortly before bed.  He continues to drink 3 cups of coffee in the morning does not consume chocolate or peppermint.  He takes 40 mg of Nexium every morning.  Current medications include Nexium 40 mg, DuoNeb nebulizer solution as needed or albuterol inhaler as needed, Qvar 80 as needed, Symbicort 160 -2 puffs twice a day Nasonex nasal spray as needed, and a multivitamin once a day.  Drug Allergies:  Allergies  Allergen Reactions  . Levaquin [Levofloxacin In D5w]     Physical Exam: BP (!) 172/90   Pulse 72   Resp 16    Physical Exam  Constitutional: He is oriented to person, place, and time. He appears well-developed and well-nourished.  HENT:  Right Ear: External ear normal.  Left Ear: External ear normal.  Eyes normal.  Ears normal.  Bilateral nares with no erythema or edema.  No nasal drainage noted pharynx slightly erythematous and edematous with no exudate noted.  Eyes: Conjunctivae are normal.  Neck: Normal range of motion.  Cardiovascular: Normal rate, regular rhythm and normal heart sounds.  S1 S2 normal.  Ocular heart rate and rhythm.  No murmur noted.  Pulmonary/Chest: Effort normal and breath sounds normal.  Lung sounds clear to auscultation  Musculoskeletal: Normal range of motion.  Neurological: He is alert and oriented to person, place, and time.  Skin: Skin is warm and  dry.  Psychiatric: He has a normal mood and affect. His behavior is normal. Judgment and thought content normal.    Diagnostics: FVC is 4.46, FEV1 2.74.  Predicted FVC 4.83, predicted FEV1 3.65.  Spirometry indicates mild airway obstruction.  Post bronchodilator therapy FVC 4.75.  FEV1 3.08 indicating 12% increase in FEV1.  Assessment and Plan: 1. Asthma, not well controlled, moderate persistent, with acute exacerbation   2. LPRD (laryngopharyngeal reflux disease)   3. Allergic rhinoconjunctivitis     Meds ordered this encounter  Medications  . albuterol  (VENTOLIN HFA) 108 (90 Base) MCG/ACT inhaler    Sig: Inhale 2 puffs into the lungs every 4 (four) hours as needed for wheezing or shortness of breath.    Dispense:  3 Inhaler    Refill:  1  . ipratropium-albuterol (DUONEB) 0.5-2.5 (3) MG/3ML nebulizer solution 3 mL  . fluconazole (DIFLUCAN) 150 MG tablet    Sig: Take 1 tablet once, then repeat with one tablet in 3 days    Dispense:  2 tablet    Refill:  0    Patient Instructions  Continue Symbicort 160- 2 puffs twice a day with a spacer to prevent cough, shortness of breath, and wheeze Continue Qvar 80- 2 puffs twice a day as needed for shortness of breath, cough, or wheeze Continue Ventolin inhaler 2 puffs every 4 hours as needed for shortness of breath, cough, or wheeze Nasacort 2 sprays in each nostril one time a day as needed for a stuffy nose Continue Xyzal 5 mg one time a day as needed for a runny nose Consider a nasal sailne wash prior to using Nasacort Continue Nexium 40 mg one time a day  Begin ranitidine 150 mg twice a day Prednisone 10 mg. Take 2 tablets twice a day for 3 days, then take 2 tablets on the 4th day, then take 1 tablet on the 5th day. Then stop  Try to decrease caffeine intake  Continue other medications as listed in your chart  Call me if this treatment plan is not wiring for you  Follow up in 2 months or sooner as needed     Return in about 2 months (around 11/21/2017), or if symptoms worsen or fail to improve.    Thank you for the opportunity to care for this patient.  Please do not hesitate to contact me with questions.  Gareth Morgan, FNP [facility]

## 2017-09-23 NOTE — Patient Instructions (Addendum)
Continue Symbicort 160- 2 puffs twice a day with a spacer to prevent cough, shortness of breath, and wheeze Continue Qvar 80- 2 puffs twice a day as needed for shortness of breath, cough, or wheeze Continue Ventolin inhaler 2 puffs every 4 hours as needed for shortness of breath, cough, or wheeze Nasacort 2 sprays in each nostril one time a day as needed for a stuffy nose Continue Xyzal 5 mg one time a day as needed for a runny nose Consider a nasal sailne wash prior to using Nasacort Continue Nexium 40 mg one time a day  Begin ranitidine 150 mg twice a day Prednisone 10 mg. Take 2 tablets twice a day for 3 days, then take 2 tablets on the 4th day, then take 1 tablet on the 5th day. Then stop  Try to decrease caffeine intake  Continue other medications as listed in your chart  Call me if this treatment plan is not wiring for you  Follow up in 2 months or sooner as needed

## 2017-10-03 DIAGNOSIS — F410 Panic disorder [episodic paroxysmal anxiety] without agoraphobia: Secondary | ICD-10-CM | POA: Diagnosis not present

## 2017-10-03 DIAGNOSIS — Z6827 Body mass index (BMI) 27.0-27.9, adult: Secondary | ICD-10-CM | POA: Diagnosis not present

## 2017-10-06 DIAGNOSIS — C44529 Squamous cell carcinoma of skin of other part of trunk: Secondary | ICD-10-CM | POA: Diagnosis not present

## 2017-10-10 DIAGNOSIS — S0531XA Ocular laceration without prolapse or loss of intraocular tissue, right eye, initial encounter: Secondary | ICD-10-CM | POA: Diagnosis not present

## 2017-10-20 DIAGNOSIS — R972 Elevated prostate specific antigen [PSA]: Secondary | ICD-10-CM | POA: Diagnosis not present

## 2017-10-20 DIAGNOSIS — N401 Enlarged prostate with lower urinary tract symptoms: Secondary | ICD-10-CM | POA: Diagnosis not present

## 2017-10-20 DIAGNOSIS — N486 Induration penis plastica: Secondary | ICD-10-CM | POA: Diagnosis not present

## 2017-10-20 DIAGNOSIS — R351 Nocturia: Secondary | ICD-10-CM | POA: Diagnosis not present

## 2017-10-26 DIAGNOSIS — C44529 Squamous cell carcinoma of skin of other part of trunk: Secondary | ICD-10-CM | POA: Diagnosis not present

## 2017-10-26 DIAGNOSIS — L578 Other skin changes due to chronic exposure to nonionizing radiation: Secondary | ICD-10-CM | POA: Diagnosis not present

## 2017-10-26 DIAGNOSIS — L820 Inflamed seborrheic keratosis: Secondary | ICD-10-CM | POA: Diagnosis not present

## 2017-11-10 ENCOUNTER — Ambulatory Visit: Payer: Medicare Other | Admitting: Allergy and Immunology

## 2017-11-24 ENCOUNTER — Encounter: Payer: Self-pay | Admitting: Allergy and Immunology

## 2017-11-24 ENCOUNTER — Ambulatory Visit (INDEPENDENT_AMBULATORY_CARE_PROVIDER_SITE_OTHER): Payer: Medicare Other | Admitting: Allergy and Immunology

## 2017-11-24 VITALS — BP 130/80 | HR 68 | Resp 12

## 2017-11-24 DIAGNOSIS — J4540 Moderate persistent asthma, uncomplicated: Secondary | ICD-10-CM | POA: Diagnosis not present

## 2017-11-24 DIAGNOSIS — K219 Gastro-esophageal reflux disease without esophagitis: Secondary | ICD-10-CM

## 2017-11-24 DIAGNOSIS — J3089 Other allergic rhinitis: Secondary | ICD-10-CM

## 2017-11-24 NOTE — Patient Instructions (Addendum)
  1. Symbicort 160- 2 inhalations twice a day   2. Add Qvar 2 inhalations twice a day to Symbicort during "flareup"  3. Nasonex 2 sprays each nostril one time per day during your flare up. May decrease to 1 spray when back to baseline  4. Nexium 40 mg one tablet once a day. Can increase to twice a day during "flareup"  5. Ventolin HFA 2 puffs every 4-6 hours if needed   6. Over-the-counter nasal saline nasal wash daily  7. Daily antihistamine if needed  8. Return to clinic in 6 months or earlier if problem

## 2017-11-24 NOTE — Progress Notes (Signed)
Follow-up Note  Referring Provider: Nicoletta Dress, MD Primary Provider: Nicoletta Dress, MD Date of Office Visit: 11/24/2017  Subjective:   Kyle [name] (DOB: 1951-08-05) is a 67 y.o. male who returns to the [facility] on 11/24/2017 in re-evaluation of the following:  HPI: Kyle [name] returns to this clinic in reevaluation of his asthma and allergic rhinitis and history of chronic sinusitis and LPR.  His last visit with me was 90 August 2018 but he visited with our nurse practitioner on 23 September 2017 for what appeared to be a slight respiratory tract exacerbation possibly precipitated by a viral respiratory tract infection.  Ever since that event he has slowly improved but he still has a little bit of intermittent chest tightness although he does not need to use a short acting bronchodilator.  He did activate his action plan which includes adding Qvar to his Symbicort and has continue to utilize this plan.  He still can golf 3-4 times per week without any problem.  He is somewhat limited in doing extreme aerobic activity because of his left knee issue.  Overall he thinks his upper airway is doing well and his reflux is under control and his throat is doing well.  Allergies as of 11/24/2017      Reactions   Levaquin [levofloxacin In D5w]       Medication List      albuterol (2.5 MG/3ML) 0.083% nebulizer solution Commonly known as:  PROVENTIL INHALE ONE VIAL IN NEBULIZER every 4-6 hours as needed FOR WHEEZING   albuterol 108 (90 Base) MCG/ACT inhaler Commonly known as:  VENTOLIN HFA Inhale 2 puffs into the lungs every 4 (four) hours as needed for wheezing or shortness of breath.   ALPRAZolam 0.5 MG tablet Commonly known as:  XANAX TAKE ONE TABLET BY MOUTH at 830 pm   beclomethasone 80 MCG/ACT inhaler Commonly known as:  QVAR REDIHALER Inhale 2 Doses into the lungs 2 (two) times daily as needed (during asthma flare-up). Rinse, gargle, and spit after  use.   budesonide-formoterol 160-4.5 MCG/ACT inhaler Commonly known as:  SYMBICORT Inhale two puffs twice a day to prevent cough or wheeze.  Rinse, gargle, and spit after use.   esomeprazole 40 MG capsule Commonly known as:  NEXIUM Take 1 capsule (40 mg total) by mouth daily. Can increase to twice a day during flare-up.   GARLIC PO Take by mouth daily.   GINGER PO Take by mouth daily.   mometasone 50 MCG/ACT nasal spray Commonly known as:  NASONEX Use 1-2 sprays in each nostril once daily.   MULTIVITAMIN PO Take by mouth.       Past Medical History:  Diagnosis Date  . Asthma   . Cluster headache   . Reflux   . Sinusitis     Past Surgical History:  Procedure Laterality Date  . bone spur removed    . KNEE ARTHROPLASTY     x 2  . SINOSCOPY     6 SURGERIES  . tear duct replacement      Review of systems negative except as noted in HPI / PMHx or noted below:  Review of Systems  Constitutional: Negative.   HENT: Negative.   Eyes: Negative.   Respiratory: Negative.   Cardiovascular: Negative.   Gastrointestinal: Negative.   Genitourinary: Negative.   Musculoskeletal: Negative.   Skin: Negative.   Neurological: Negative.   Endo/Heme/Allergies: Negative.   Psychiatric/Behavioral: Negative.      Objective:  Vitals:   11/24/17 0849  BP: 130/80  Pulse: 68  Resp: 12          Physical Exam  Constitutional: He is well-developed, well-nourished, and in no distress.  HENT:  Head: Normocephalic.  Right Ear: Tympanic membrane, external ear and ear canal normal.  Left Ear: Tympanic membrane, external ear and ear canal normal.  Nose: Nose normal. No mucosal edema or rhinorrhea.  Mouth/Throat: Uvula is midline, oropharynx is clear and moist and mucous membranes are normal. No oropharyngeal exudate.  Eyes: Conjunctivae are normal.  Neck: Trachea normal. No tracheal tenderness present. No tracheal deviation present. No thyromegaly present.  Cardiovascular:  Normal rate, regular rhythm, S1 normal, S2 normal and normal heart sounds.  No murmur heard. Pulmonary/Chest: Breath sounds normal. No stridor. No respiratory distress. He has no wheezes. He has no rales.  Musculoskeletal: He exhibits no edema.  Lymphadenopathy:       Head (right side): No tonsillar adenopathy present.       Head (left side): No tonsillar adenopathy present.    He has no cervical adenopathy.  Neurological: He is alert. Gait normal.  Skin: No rash noted. He is not diaphoretic. No erythema. Nails show no clubbing.  Psychiatric: Mood and affect normal.    Diagnostics:    Spirometry was performed and demonstrated an FEV1 of 2.68 at 72 % of predicted.  The patient had an Asthma Control Test with the following results: ACT Total Score: 15.    Assessment and Plan:   1. Asthma, moderate persistent, well-controlled   2. Other allergic rhinitis   3. LPRD (laryngopharyngeal reflux disease)     1. Symbicort 160- 2 inhalations twice a day   2. Add Qvar 2 inhalations twice a day to Symbicort during "flareup"  3. Nasonex 2 sprays each nostril one time per day during your flare up. May decrease to 1 spray when back to baseline  4. Nexium 40 mg one tablet once a day. Can increase to twice a day during "flareup"  5. Ventolin HFA 2 puffs every 4-6 hours if needed   6. Over-the-counter nasal saline nasal wash daily  7. Daily antihistamine if needed  8. Return to clinic in 6 months or earlier if problem   Overall Kyle [name] appears to be doing relatively well and we will not change around any of his medical plan at this point in time and assume that he will continue to show improvement regarding his respiratory tract inflations inflammation as he moves forward.  I will see him back in this clinic in approximately 6 months or earlier if there is a problem.  Allena Katz, MD Allergy / Immunology [place]

## 2017-11-28 ENCOUNTER — Encounter: Payer: Self-pay | Admitting: Allergy and Immunology

## 2017-12-02 ENCOUNTER — Other Ambulatory Visit: Payer: Self-pay | Admitting: *Deleted

## 2017-12-02 MED ORDER — ALBUTEROL SULFATE HFA 108 (90 BASE) MCG/ACT IN AERS: INHALATION_SPRAY | RESPIRATORY_TRACT | 1 refills | Status: DC

## 2017-12-02 MED ORDER — ALBUTEROL SULFATE HFA 108 (90 BASE) MCG/ACT IN AERS: 2.0000 | INHALATION_SPRAY | RESPIRATORY_TRACT | 1 refills | Status: DC | PRN

## 2017-12-02 MED ORDER — MOMETASONE FUROATE 50 MCG/ACT NA SUSP: NASAL | 3 refills | Status: DC

## 2017-12-02 MED ORDER — ESOMEPRAZOLE MAGNESIUM 40 MG PO CPDR: 40.0000 mg | DELAYED_RELEASE_CAPSULE | Freq: Every day | ORAL | 3 refills | Status: DC

## 2017-12-02 MED ORDER — BUDESONIDE-FORMOTEROL FUMARATE 160-4.5 MCG/ACT IN AERO: INHALATION_SPRAY | RESPIRATORY_TRACT | 3 refills | Status: DC

## 2017-12-13 DIAGNOSIS — R6889 Other general symptoms and signs: Secondary | ICD-10-CM | POA: Diagnosis not present

## 2017-12-15 ENCOUNTER — Ambulatory Visit (INDEPENDENT_AMBULATORY_CARE_PROVIDER_SITE_OTHER): Payer: Medicare Other | Admitting: Allergy and Immunology

## 2017-12-15 ENCOUNTER — Encounter: Payer: Self-pay | Admitting: Allergy and Immunology

## 2017-12-15 VITALS — BP 156/82 | HR 76 | Resp 14

## 2017-12-15 DIAGNOSIS — J3089 Other allergic rhinitis: Secondary | ICD-10-CM

## 2017-12-15 DIAGNOSIS — B370 Candidal stomatitis: Secondary | ICD-10-CM

## 2017-12-15 DIAGNOSIS — J4540 Moderate persistent asthma, uncomplicated: Secondary | ICD-10-CM | POA: Diagnosis not present

## 2017-12-15 DIAGNOSIS — K219 Gastro-esophageal reflux disease without esophagitis: Secondary | ICD-10-CM

## 2017-12-15 MED ORDER — FLUCONAZOLE 150 MG PO TABS: 150.0000 mg | ORAL_TABLET | Freq: Every day | ORAL | 0 refills | Status: AC

## 2017-12-15 MED ORDER — NYSTATIN 100000 UNIT/ML MT SUSP: 5.0000 mL | Freq: Three times a day (TID) | OROMUCOSAL | 0 refills | Status: AC

## 2017-12-15 NOTE — Patient Instructions (Addendum)
  1. Continue Symbicort 160- 2 inhalations twice a day   2. Add Qvar 80 - 2 inhalations twice a day to Symbicort during "flareup"  3. Continue Nasonex 1-2 sprays each nostril one time per day    4. Continue Nexium 40 mg one tablet once a day. Can increase to twice a day during "flareup"  5. Over-the-counter nasal saline nasal wash daily  6. If needed:   A. Ventolin HFA or similar 2 inhalations every 4-6 hours  B. Daily antihistamine   C. Mucinex DM  7. Treat fungal infection:   A. Diflucan 150 one tablet one time per day for 3 days  B. Nystatin oral solution 77ml - 3 times a day swish and swallow  8. Return to clinic in 6 months or earlier if problem

## 2017-12-15 NOTE — Progress Notes (Signed)
Follow-up Note  Referring Provider: Nicoletta Dress, MD Primary Provider: Nicoletta Dress, MD Date of Office Visit: 12/15/2017  Subjective:   Kyle [name] (DOB: 04/17/1951) is a 67 y.o. male who returns to the [facility] [place] on 12/15/2017 in re-evaluation of the following:  HPI: Kyle [name] returns to this clinic in reevaluation of asthma and allergic rhinitis and history of chronic sinusitis and LPR.  His last visit to this clinic was 24 November 2017 at which point time he appeared to be doing relatively well.  However, early this week he developed a fever and became achy all over his body with nasal congestion and white nasal discharge and he saw Dr. Delena Bali who treated him empirically with Tamiflu.  His fever resolved as of yesterday but now he has unrelenting coughing and really bad laryngitis.  His throat hurts like crazy.  He does not have any chest pain or ugly sputum production or ugly upper airway discharge or headaches.  He use some narcotic-based cough medication last night because his coughing was so bad.  Allergies as of 12/15/2017      Reactions   Levaquin [levofloxacin In D5w]       Medication List      albuterol (2.5 MG/3ML) 0.083% nebulizer solution Commonly known as:  PROVENTIL INHALE ONE VIAL IN NEBULIZER every 4-6 hours as needed FOR WHEEZING   albuterol 108 (90 Base) MCG/ACT inhaler Commonly known as:  PROAIR HFA Inhale two puffs every 4-6 hours if needed for cough or wheeze.   ALPRAZolam 0.5 MG tablet Commonly known as:  XANAX TAKE ONE TABLET BY MOUTH at 830 pm   beclomethasone 80 MCG/ACT inhaler Commonly known as:  QVAR REDIHALER Inhale 2 Doses into the lungs 2 (two) times daily as needed (during asthma flare-up). Rinse, gargle, and spit after use.   budesonide-formoterol 160-4.5 MCG/ACT inhaler Commonly known as:  SYMBICORT Inhale two puffs twice a day to prevent cough or wheeze.  Rinse, gargle, and spit after use.   CHERATUSSIN  AC PO Take by mouth as needed.   esomeprazole 40 MG capsule Commonly known as:  NEXIUM Take 1 capsule (40 mg total) by mouth daily. Can increase to twice a day during flare-up.   fluconazole 150 MG tablet Commonly known as:  DIFLUCAN Take 1 tablet (150 mg total) by mouth daily for 3 days.   GARLIC PO Take by mouth daily.   GINGER PO Take by mouth daily.   mometasone 50 MCG/ACT nasal spray Commonly known as:  NASONEX Use 1-2 sprays in each nostril once daily.   MULTIVITAMIN PO Take by mouth.   nystatin 100000 UNIT/ML suspension Commonly known as:  MYCOSTATIN Take 5 mLs (500,000 Units total) by mouth 3 (three) times daily for 10 days.   oseltamivir 75 MG capsule Commonly known as:  TAMIFLU Take 75 mg by mouth 2 (two) times daily.   URINOZINC PO Take by mouth 2 (two) times daily.       Past Medical History:  Diagnosis Date  . Asthma   . Cluster headache   . Reflux   . Sinusitis     Past Surgical History:  Procedure Laterality Date  . bone spur removed    . KNEE ARTHROPLASTY     x 2  . SINOSCOPY     6 SURGERIES  . tear duct replacement      Review of systems negative except as noted in HPI / PMHx or noted below:  Review of  Systems  Constitutional: Negative.   HENT: Negative.   Eyes: Negative.   Respiratory: Negative.   Cardiovascular: Negative.   Gastrointestinal: Negative.   Genitourinary: Negative.   Musculoskeletal: Negative.   Skin: Negative.   Neurological: Negative.   Endo/Heme/Allergies: Negative.   Psychiatric/Behavioral: Negative.      Objective:   Vitals:   12/15/17 1622  BP: (!) 156/82  Pulse: 76  Resp: 14          Physical Exam  Constitutional: He is well-developed, well-nourished, and in no distress.  Gravelly voice, intermittent coughing spells  HENT:  Head: Normocephalic.  Right Ear: Tympanic membrane, external ear and ear canal normal.  Left Ear: Tympanic membrane, external ear and ear canal normal.  Nose: Nose  normal. No mucosal edema or rhinorrhea.  Mouth/Throat: Uvula is midline and mucous membranes are normal. Oropharyngeal exudate (Thrush) present. No posterior oropharyngeal erythema.  Eyes: Conjunctivae are normal.  Neck: Trachea normal. No tracheal tenderness present. No tracheal deviation present. No thyromegaly present.  Cardiovascular: Normal rate, regular rhythm, S1 normal, S2 normal and normal heart sounds.  No murmur heard. Pulmonary/Chest: Breath sounds normal. No stridor. No respiratory distress. He has no wheezes. He has no rales.  Musculoskeletal: He exhibits no edema.  Lymphadenopathy:       Head (right side): No tonsillar adenopathy present.       Head (left side): No tonsillar adenopathy present.    He has no cervical adenopathy.  Neurological: He is alert. Gait normal.  Skin: No rash noted. He is not diaphoretic. No erythema. Nails show no clubbing.  Psychiatric: Mood and affect normal.    Diagnostics:    Spirometry was not performed.   The patient had an Asthma Control Test with the following results: ACT Total Score: 17.    Assessment and Plan:   1. Asthma, moderate persistent, well-controlled   2. Other allergic rhinitis   3. LPRD (laryngopharyngeal reflux disease)   4. Thrush     1. Continue Symbicort 160- 2 inhalations twice a day   2. Add Qvar 80 - 2 inhalations twice a day to Symbicort during "flareup"  3. Continue Nasonex 1-2 sprays each nostril one time per day    4. Continue Nexium 40 mg one tablet once a day. Can increase to twice a day during "flareup"  5. Over-the-counter nasal saline nasal wash daily  6. If needed:   A. Ventolin HFA or similar 2 inhalations every 4-6 hours  B. Daily antihistamine   C. Mucinex DM  7. Treat fungal infection:   A. Diflucan 150 one tablet one time per day for 3 days  B. Nystatin oral solution 44ml - 3 times a day swish and swallow  8. Return to clinic in 6 months or earlier if problem   Kyle [name] has some form  of viral infection that has really torn up his mid airway and he appears to also have thrush and he will utilize the therapy noted above to address these issues while maintaining on anti-inflammatory agents to control his asthma and also using therapy directed against reflux.  Further evaluation and treatment will be based upon his response.  If he does well I will see him back in this clinic in 6 months or earlier if there is a problem.  Allena Katz, MD Allergy / Immunology [place]

## 2017-12-17 DIAGNOSIS — R05 Cough: Secondary | ICD-10-CM | POA: Diagnosis not present

## 2017-12-19 ENCOUNTER — Encounter: Payer: Self-pay | Admitting: Allergy and Immunology

## 2017-12-22 ENCOUNTER — Ambulatory Visit (INDEPENDENT_AMBULATORY_CARE_PROVIDER_SITE_OTHER): Payer: Medicare Other | Admitting: Allergy and Immunology

## 2017-12-22 ENCOUNTER — Encounter: Payer: Self-pay | Admitting: Allergy and Immunology

## 2017-12-22 VITALS — BP 140/78 | HR 72 | Resp 20

## 2017-12-22 DIAGNOSIS — B370 Candidal stomatitis: Secondary | ICD-10-CM | POA: Diagnosis not present

## 2017-12-22 DIAGNOSIS — K219 Gastro-esophageal reflux disease without esophagitis: Secondary | ICD-10-CM | POA: Diagnosis not present

## 2017-12-22 DIAGNOSIS — J3089 Other allergic rhinitis: Secondary | ICD-10-CM | POA: Diagnosis not present

## 2017-12-22 DIAGNOSIS — J4541 Moderate persistent asthma with (acute) exacerbation: Secondary | ICD-10-CM | POA: Diagnosis not present

## 2017-12-22 MED ORDER — FLUCONAZOLE 150 MG PO TABS: 150.0000 mg | ORAL_TABLET | Freq: Every day | ORAL | 0 refills | Status: AC

## 2017-12-22 NOTE — Patient Instructions (Addendum)
  1. Continue Symbicort 160- 2 inhalations twice a day   2. Add Qvar 80 - 2 inhalations twice a day to Symbicort during "flareup"  3. Continue Nasonex 1-2 sprays each nostril one time per day    4. Continue Nexium 40 mg one tablet once a day. Can increase to twice a day during "flareup"  5. Over-the-counter nasal saline nasal wash daily    6. If needed:   A. Ventolin HFA or similar 2 inhalations every 4-6 hours  B. Daily antihistamine   C. Mucinex DM  7. Treat and prevent fungal infection:   A. Diflucan 150 one tablet every other day for 3 doses  8. Return to clinic in 6 months or earlier if problem

## 2017-12-22 NOTE — Progress Notes (Signed)
Follow-up Note  Referring Provider: Nicoletta Dress, MD   Primary Provider: Nicoletta Dress, MD Date of Office Visit: 12/22/2017  Subjective:   Kyle [name] (DOB: 1951/03/25) is a 67 y.o. male who returns to the [facility] [place] on 12/22/2017 in re-evaluation of the following:  HPI: Kyle [name] returns to this clinic in reevaluation of respiratory tract symptoms.  His last visit to this clinic was 15 December 2017 at which point in time he appeared to be having some type of viral syndrome treated empirically with Tamiflu by Dr. Delena Bali associated with coughing and laryngitis complicated by the development of very significant thrush.  He subsequently spiked another fever up to 99.7 this weekend along with unrelenting coughing to the point where he developed back pain and he was evaluated by Dr. Delena Bali who treated him with high-dose systemic steroids and azithromycin.  He may have had a little bit of green sputum production but otherwise no other additional respiratory tract symptoms.  His throat is feeling better since he utilized therapy for his thrush.  He has been developing some side effects from his high-dose steroids including insomnia and agitation and anxiety.  Allergies as of 12/22/2017      Reactions   Levaquin [levofloxacin In D5w]       Medication List      acetaminophen-codeine 300-30 MG tablet Commonly known as:  TYLENOL #3 Take 1 tablet by mouth at bedtime as needed.   albuterol (2.5 MG/3ML) 0.083% nebulizer solution Commonly known as:  PROVENTIL INHALE ONE VIAL IN NEBULIZER every 4-6 hours as needed FOR WHEEZING   albuterol 108 (90 Base) MCG/ACT inhaler Commonly known as:  PROAIR HFA Inhale two puffs every 4-6 hours if needed for cough or wheeze.   ALPRAZolam 0.5 MG tablet Commonly known as:  XANAX TAKE ONE TABLET BY MOUTH at 830 pm   azithromycin 500 MG tablet Commonly known as:  ZITHROMAX Take 500 mg by mouth daily.   beclomethasone 80  MCG/ACT inhaler Commonly known as:  QVAR REDIHALER Inhale 2 Doses into the lungs 2 (two) times daily as needed (during asthma flare-up). Rinse, gargle, and spit after use.   benzonatate 200 MG capsule Commonly known as:  TESSALON Take 1 capsule by mouth 3 (three) times daily as needed.   budesonide-formoterol 160-4.5 MCG/ACT inhaler Commonly known as:  SYMBICORT Inhale two puffs twice a day to prevent cough or wheeze.  Rinse, gargle, and spit after use.   CHERATUSSIN AC PO Take by mouth as needed.   cyclobenzaprine 10 MG tablet Commonly known as:  FLEXERIL Take 1 tablet by mouth at bedtime as needed.   esomeprazole 40 MG capsule Commonly known as:  NEXIUM Take 1 capsule (40 mg total) by mouth daily. Can increase to twice a day during flare-up.   GARLIC PO Take by mouth daily.   GINGER PO Take by mouth daily.   mometasone 50 MCG/ACT nasal spray Commonly known as:  NASONEX Use 1-2 sprays in each nostril once daily.   MULTIVITAMIN PO Take by mouth.   nystatin 100000 UNIT/ML suspension Commonly known as:  MYCOSTATIN Take 5 mLs (500,000 Units total) by mouth 3 (three) times daily for 10 days.   predniSONE 10 MG tablet Commonly known as:  DELTASONE TAKE 6 TABLETS FOR 3 DAYS, TAKE 4 TABLETS FOR 3 DAYS, TAKE 2 TABLETS FOR 3 DAYS, TAKE ONE TABLET FOR 3 DAYS   URINOZINC PO Take by mouth 2 (two) times daily.  Past Medical History:  Diagnosis Date  . Asthma   . Cluster headache   . Reflux   . Sinusitis     Past Surgical History:  Procedure Laterality Date  . bone spur removed    . KNEE ARTHROPLASTY     x 2  . SINOSCOPY     6 SURGERIES  . tear duct replacement      Review of systems negative except as noted in HPI / PMHx or noted below:  Review of Systems  Constitutional: Negative.   HENT: Negative.   Eyes: Negative.   Respiratory: Negative.   Cardiovascular: Negative.   Gastrointestinal: Negative.   Genitourinary: Negative.   Musculoskeletal:  Negative.   Skin: Negative.   Neurological: Negative.   Endo/Heme/Allergies: Negative.   Psychiatric/Behavioral: Negative.      Objective:   Vitals:   12/22/17 1057  BP: 140/78  Pulse: 72  Resp: 20          Physical Exam  HENT:  Head: Normocephalic.  Right Ear: Tympanic membrane, external ear and ear canal normal.  Left Ear: Tympanic membrane, external ear and ear canal normal.  Nose: Nose normal. No mucosal edema or rhinorrhea.  Mouth/Throat: Oropharynx is clear and moist and mucous membranes are normal. No oropharyngeal exudate.  Eyes: Conjunctivae are normal.  Neck: Trachea normal. No tracheal tenderness present. No tracheal deviation present. No thyromegaly present.  Cardiovascular: Normal rate, regular rhythm, S1 normal, S2 normal and normal heart sounds.  No murmur heard. Pulmonary/Chest: Breath sounds normal. No stridor. No respiratory distress. He has no wheezes. He has no rales.  Musculoskeletal: He exhibits no edema.  Lymphadenopathy:       Head (right side): No tonsillar adenopathy present.       Head (left side): No tonsillar adenopathy present.    He has no cervical adenopathy.  Neurological: He is alert.  Skin: No rash noted. He is not diaphoretic. No erythema. Nails show no clubbing.    Diagnostics:   The patient had an Asthma Control Test with the following results: ACT Total Score: 9.    Assessment and Plan:   1. Asthma, not well controlled, moderate persistent, with acute exacerbation   2. Other allergic rhinitis   3. LPRD (laryngopharyngeal reflux disease)   4. Thrush     1. Continue Symbicort 160- 2 inhalations twice a day   2. Add Qvar 80 - 2 inhalations twice a day to Symbicort during "flareup"  3. Continue Nasonex 1-2 sprays each nostril one time per day    4. Continue Nexium 40 mg one tablet once a day. Can increase to twice a day during "flareup"  5. Over-the-counter nasal saline nasal wash daily    6. If needed:   A. Ventolin  HFA or similar 2 inhalations every 4-6 hours  B. Daily antihistamine   C. Mucinex DM  7. Treat and prevent fungal infection:   A. Diflucan 150 one tablet every other day for 3 doses  8. Return to clinic in 6 months or earlier if problem   Kyle [name] has a viral syndrome and I informed him that his airflow is good but his chest will remain inflamed for another week or 2 and he should be very careful about overusing bronchodilators and receiving more systemic steroids as he moves forward.  He had very significant thrush and I suspect fungal induced laryngitis during his last visit and I am going to give him a few more doses of Diflucan while he finishes up  his prednisone course.  Assuming he does well over the course of the next 14 days I will just see him back in his clinic in 6 months or earlier if there is a problem.  Allena Katz, MD Allergy / Immunology [place]

## 2017-12-26 ENCOUNTER — Encounter: Payer: Self-pay | Admitting: Allergy and Immunology

## 2018-02-21 ENCOUNTER — Other Ambulatory Visit: Payer: Self-pay | Admitting: *Deleted

## 2018-02-21 MED ORDER — FLUCONAZOLE 150 MG PO TABS: 150.0000 mg | ORAL_TABLET | Freq: Once | ORAL | 0 refills | Status: AC

## 2018-02-21 NOTE — Telephone Encounter (Signed)
Patient advised thrush symptoms from using Qvar. Per Dr Neldon Mc rx Diflucan sent to [facility]

## 2018-02-21 NOTE — Progress Notes (Signed)
[facility]  PATIENT: Kyle [name] DOB: 1951-09-01   REASON FOR VISIT: Follow-up for history of cluster headache  Intermittent left facial left hand paresthesias intermittent diplopia , now complaining with pressure-like feeling in his head, shoulder tingling feels off balance  HISTORY FROM: Patient    HISTORY OF PRESENT ILLNESS: Kyle [name] is a 67 year old right male, seen in refer by his ENT  Kyle [name] for evaluation of headaches, his primary care is Dr.,Kyle [name], Kyle [name], initial evaluation was on July 11 2017  He Had past medical history of chronic sinusitis, had sinus surgery in the past, past medical history of acid reflux, hip and knee surgery, left tear duct surgery He denied previous history of headache, since June 2018, he had intermittent left facial numbness, spreading to left arm, lightheadedness, unsteadiness sensation, no vertigo, per patient, he had ENT vestibular evaluation, was told of decreased left vestibular function, but with preserved hearing at the left side, He denies visual loss, he also complains of intermittent double vision, feeling fatigued, He reported a history of now complaining with pressure-like feeling in his head and off balance cluster headache, left side retro-orbital area iced piercing headaches, lasting 20 minutes, happen every night, with associated tearing, stuffy nose. His headache responding well to Imitrex 50 mg as needed I personally reviewed MRI of the brain without contrast in August 2018, mild generalized atrophy, evidence of severe chronic sinusitis  UPDATE 6/12/2019CM [name] 67 year old male returns for follow-up with a history of cluster headache, intermittent hand paresthesias, pressure type sensation in his head.  He denies specific headache.  He denies diplopia , he describes the pressure sensation as a pulling sensation.  It does not interfere with his ability to perform  activities of daily living, it does not interfere with his ability to sleep at night.  He has not had any falls.  MRI of the brain in August 2018 per Dr. Rhea Belton note mild generalized atrophy and evidence of severe chronic sinusitis.  He returns for reevaluation  REVIEW OF SYSTEMS: Full 14 system review of systems performed and notable only for those listed, all others are neg:  Constitutional: Fatigue Cardiovascular: neg Ear/Nose/Throat: neg  Skin: neg Eyes: neg Respiratory: Asthma numbness Gastroitestinal: neg  Hematology/Lymphatic: neg  Endocrine: neg Musculoskeletal:neg Allergy/Immunology: neg Neurological: Numbness history of cluster headaches Psychiatric: neg Sleep : neg   ALLERGIES: Allergies  Allergen Reactions  . Augmentin [Amoxicillin-Pot Clavulanate]     Could not tolerate.  Anner Crete In D5w]     HOME MEDICATIONS: Outpatient Medications Prior to Visit  Medication Sig Dispense Refill  . albuterol (PROAIR HFA) 108 (90 Base) MCG/ACT inhaler Inhale two puffs every 4-6 hours if needed for cough or wheeze. 3 Inhaler 1  . albuterol (PROVENTIL) (2.5 MG/3ML) 0.083% nebulizer solution INHALE ONE VIAL IN NEBULIZER every 4-6 hours as needed FOR WHEEZING  0  . ALPRAZolam (XANAX) 0.5 MG tablet TAKE ONE TABLET BY MOUTH at 830 pm prn  2  . Beclomethasone Diprop HFA (QVAR REDIHALER) 80 MCG/ACT AERB Inhale 2 Doses into the lungs 2 (two) times daily as needed (during asthma flare-up). Rinse, gargle, and spit after use. 3 Inhaler 3  . budesonide-formoterol (SYMBICORT) 160-4.5 MCG/ACT inhaler Inhale two puffs twice a day to prevent cough or wheeze.  Rinse, gargle, and spit after use. 3 Inhaler 3  . fluconazole (DIFLUCAN) 150 MG tablet Take 150 mg by mouth once.    Marland Kitchen GARLIC PO Take by mouth daily.    Marland Kitchen  Ginger, Zingiber officinalis, (GINGER PO) Take by mouth daily.    . mometasone (NASONEX) 50 MCG/ACT nasal spray Use 1-2 sprays in each nostril once daily. 51 g 3  . Multiple  Vitamins-Minerals (MULTIVITAMIN PO) Take by mouth.    . esomeprazole (NEXIUM) 40 MG capsule Take 1 capsule (40 mg total) by mouth daily. Can increase to twice a day during flare-up. (Patient not taking: Reported on 02/22/2018) 180 capsule 3  . Misc Natural Products (URINOZINC PO) Take by mouth 2 (two) times daily as needed.     Marland Kitchen acetaminophen-codeine (TYLENOL #3) 300-30 MG tablet Take 1 tablet by mouth at bedtime as needed.  0  . azithromycin (ZITHROMAX) 500 MG tablet Take 500 mg by mouth daily.  0  . benzonatate (TESSALON) 200 MG capsule Take 1 capsule by mouth 3 (three) times daily as needed.  0  . cyclobenzaprine (FLEXERIL) 10 MG tablet Take 1 tablet by mouth at bedtime as needed.  0  . guaiFENesin-Codeine (CHERATUSSIN AC PO) Take by mouth as needed.    . predniSONE (DELTASONE) 10 MG tablet TAKE 6 TABLETS FOR 3 DAYS, TAKE 4 TABLETS FOR 3 DAYS, TAKE 2 TABLETS FOR 3 DAYS, TAKE ONE TABLET FOR 3 DAYS  0   No facility-administered medications prior to visit.     PAST MEDICAL HISTORY: Past Medical History:  Diagnosis Date  . Asthma   . Cluster headache   . Reflux   . Sinusitis     PAST SURGICAL HISTORY: Past Surgical History:  Procedure Laterality Date  . bone spur removed    . KNEE ARTHROPLASTY     x 2  . SINOSCOPY     6 SURGERIES  . tear duct replacement      FAMILY HISTORY: Family History  Problem Relation Age of Onset  . Emphysema Mother   . Heart attack Mother   . Asthma Father   . Stroke Father     SOCIAL HISTORY: Social History   Socioeconomic History  . Marital status: Married    Spouse name: Not on file  . Number of children: 2  . Years of education: Masters  . Highest education level: Not on file  Occupational History  . Occupation: Production assistant, radio    Comment: Retired Forensic psychologist  Social Needs  . Financial resource strain: Not on file  . Food insecurity:    Worry: Not on file    Inability: Not on file  . Transportation needs:    Medical: Not on  file    Non-medical: Not on file  Tobacco Use  . Smoking status: Never Smoker  . Smokeless tobacco: Never Used  Substance and Sexual Activity  . Alcohol use: No  . Drug use: No  . Sexual activity: Not on file  Lifestyle  . Physical activity:    Days per week: Not on file    Minutes per session: Not on file  . Stress: Not on file  Relationships  . Social connections:    Talks on phone: Not on file    Gets together: Not on file    Attends religious service: Not on file    Active member of club or organization: Not on file    Attends meetings of clubs or organizations: Not on file    Relationship status: Not on file  . Intimate partner violence:    Fear of current or ex partner: Not on file    Emotionally abused: Not on file    Physically abused: Not on file  Forced sexual activity: Not on file  Other Topics Concern  . Not on file  Social History Narrative   Lives at home with his wife.   Right-handed.   2.5 - 3 cups coffee per day.     PHYSICAL EXAM  Vitals:   02/22/18 0953  BP: (!) 157/86  Pulse: 77  Weight: 202 lb 9.6 oz (91.9 kg)  Height: 6' 0.5" (1.842 m)   Body mass index is 27.1 kg/m.  Generalized: Well developed, in no acute distress  Head: normocephalic and atraumatic,. Oropharynx benign  Neck: Supple,   Musculoskeletal: No deformity   Neurological examination   Mentation: Alert oriented to time, place, history taking. Attention span and concentration appropriate. Recent and remote memory intact.  Follows all commands speech and language fluent. Frequent eyebrow movements  Cranial nerve II-XII: Pupils were equal round reactive to light extraocular movements were full, visual field were full on confrontational test. Facial sensation and strength were normal. hearing was intact to finger rubbing bilaterally. Uvula tongue midline. head turning and shoulder shrug were normal and symmetric.Tongue protrusion into cheek strength was normal. Motor: normal  bulk and tone, full strength in the BUE, BLE, fine finger movements normal, no pronator drift. No focal weakness Sensory: normal and symmetric to light touch, pinprick, and  Vibration in the upper and lower extremities  Coordination: finger-nose-finger, heel-to-shin bilaterally, no dysmetria Reflexes: Brachioradialis 2/2, biceps 2/2, triceps 2/2, patellar 2/2, Achilles 2/2, plantar responses were flexor bilaterally. Gait and Station: Rising up from seated position without assistance, normal stance,  moderate stride, good arm swing, smooth turning, able to perform tiptoe, and heel walking without difficulty. Tandem gait is steady Romberg negative.   DIAGNOSTIC DATA (LABS, IMAGING, TESTING) - I reviewed patient records, labs, notes, testing and imaging myself where available.  Lab Results  Component Value Date   WBC 5.1 07/11/2017   HGB 15.1 07/11/2017   HCT 45.2 07/11/2017   MCV 92 07/11/2017   PLT 224 07/11/2017      Component Value Date/Time   NA 146 (H) 07/11/2017 0900   K 5.0 07/11/2017 0900   CL 106 07/11/2017 0900   CO2 24 07/11/2017 0900   GLUCOSE 99 07/11/2017 0900   BUN 20 07/11/2017 0900   CREATININE 1.19 07/11/2017 0900   CALCIUM 9.4 07/11/2017 0900   PROT 6.6 07/11/2017 0900   ALBUMIN 4.5 07/11/2017 0900   AST 16 07/11/2017 0900   ALT 13 07/11/2017 0900   ALKPHOS 70 07/11/2017 0900   BILITOT 0.4 07/11/2017 0900   GFRNONAA 63 07/11/2017 0900   GFRAA 73 07/11/2017 0900   Lab Results  Component Value Date   CHOL 195 07/11/2017   HDL 65 07/11/2017   LDLCALC 119 (H) 07/11/2017   TRIG 57 07/11/2017   CHOLHDL 3.0 07/11/2017    Lab Results  Component Value Date   EGBTDVVO16 073 07/11/2017   Lab Results  Component Value Date   TSH 2.490 07/11/2017      ASSESSMENT AND PLANChristopher M [name] is a 67 y.o. male   with history of cluster headache which has responded well to Imitrex as needed and intermittent left facial left hand paresthesias, pressure and  pulling sensation in the head. Normal MRI of the brain, normal neurological examinations, Laboratory evaluations, including acetylcholine receptor antibody, CBC CMP, sed rate, TSH CK, C-reactive protein , B12 and ANa with reflex all returned normal .   PLAN: Try gabapentin 100 mg at night, watch for drowsiness in the am call in  one month if the medication is beneficial we can increase in slow increments Normal neurologic exam  Exercise 30 min at least daily F/U in 6 months   Kyle [name], [facility], [facility], APRN  [facility] [address] [place], [place] [address] [phone]

## 2018-02-22 ENCOUNTER — Ambulatory Visit (INDEPENDENT_AMBULATORY_CARE_PROVIDER_SITE_OTHER): Payer: Medicare Other | Admitting: Nurse Practitioner

## 2018-02-22 ENCOUNTER — Encounter: Payer: Self-pay | Admitting: Nurse Practitioner

## 2018-02-22 VITALS — BP 157/86 | HR 77 | Ht 72.5 in | Wt 202.6 lb

## 2018-02-22 DIAGNOSIS — G44019 Episodic cluster headache, not intractable: Secondary | ICD-10-CM

## 2018-02-22 DIAGNOSIS — R202 Paresthesia of skin: Secondary | ICD-10-CM | POA: Diagnosis not present

## 2018-02-22 MED ORDER — GABAPENTIN 100 MG PO CAPS: 100.0000 mg | ORAL_CAPSULE | Freq: Every day | ORAL | 6 refills | Status: DC

## 2018-02-22 NOTE — Patient Instructions (Signed)
Try gabapentin 100 mg at night, watch for drowsiness in the am call in one month if the medication is beneficial we can increase in slow increments Normal neurologic exam  Exercise 30 min at least daily F/U in 6 months

## 2018-02-24 DIAGNOSIS — M25552 Pain in left hip: Secondary | ICD-10-CM | POA: Diagnosis not present

## 2018-02-24 DIAGNOSIS — M1612 Unilateral primary osteoarthritis, left hip: Secondary | ICD-10-CM | POA: Diagnosis not present

## 2018-02-24 NOTE — Progress Notes (Signed)
I have reviewed and agreed above plan. 

## 2018-03-17 DIAGNOSIS — J208 Acute bronchitis due to other specified organisms: Secondary | ICD-10-CM | POA: Diagnosis not present

## 2018-03-27 ENCOUNTER — Ambulatory Visit (INDEPENDENT_AMBULATORY_CARE_PROVIDER_SITE_OTHER): Payer: Medicare Other | Admitting: Allergy and Immunology

## 2018-03-27 ENCOUNTER — Encounter: Payer: Self-pay | Admitting: Allergy and Immunology

## 2018-03-27 VITALS — HR 68 | Resp 12

## 2018-03-27 DIAGNOSIS — K219 Gastro-esophageal reflux disease without esophagitis: Secondary | ICD-10-CM | POA: Diagnosis not present

## 2018-03-27 DIAGNOSIS — J4541 Moderate persistent asthma with (acute) exacerbation: Secondary | ICD-10-CM | POA: Diagnosis not present

## 2018-03-27 DIAGNOSIS — J3089 Other allergic rhinitis: Secondary | ICD-10-CM

## 2018-03-27 MED ORDER — AZITHROMYCIN 500 MG PO TABS: ORAL_TABLET | ORAL | 0 refills | Status: DC

## 2018-03-27 NOTE — Progress Notes (Signed)
Follow-up Note  Referring Provider: Nicoletta Dress, MD Primary Provider: Nicoletta Dress, MD Date of Office Visit: 03/27/2018  Subjective:   Kyle [name] (DOB: 1951/05/19) is a 67 y.o. male who returns to the [facility] [place] on 03/27/2018 in re-evaluation of the following:  HPI: Kyle [name] returns to this clinic in reevaluation of recent respiratory tract symptoms that developed approximately 9 days ago.  His last visit to this clinic was 22 December 2017.  Overall his asthma and allergic rhinitis and history of chronic sinusitis and reflux have been under excellent control since his last visit.  Unfortunately, about 2-1/2 weeks ago a coworker came into the office with unrelenting cough and approximately 9 days ago Kyle [name] developed coughing and just feeling bad in general.  He visited with his primary care doctor who gave him a Kenalog and prednisone Dosepak.  He has had a fair amount of coughing and wheezing although his cough is somewhat diminished with the therapy administered by his primary care doctor.  He has needed to use a short acting bronchodilator at least one time per day during this episode.  He has not been having any significant upper airway issues but may be some ear fullness and he has not been having any chest pain or fever.  His reflux is under very good control at this point in time.  He was given a prescription for doxycycline but he did not use this antibiotic.  It should be noted that his coworker continues to cough and has done so for the past 2-1/2 weeks.  Allergies as of 03/27/2018      Reactions   Augmentin [amoxicillin-pot Clavulanate]    Could not tolerate.   Levaquin [levofloxacin In D5w]       Medication List      albuterol (2.5 MG/3ML) 0.083% nebulizer solution Commonly known as:  PROVENTIL INHALE ONE VIAL IN NEBULIZER every 4-6 hours as needed FOR WHEEZING   albuterol 108 (90 Base) MCG/ACT inhaler Commonly known as:  PROAIR  HFA Inhale two puffs every 4-6 hours if needed for cough or wheeze.   beclomethasone 80 MCG/ACT inhaler Commonly known as:  QVAR REDIHALER Inhale 2 Doses into the lungs 2 (two) times daily as needed (during asthma flare-up). Rinse, gargle, and spit after use.   budesonide-formoterol 160-4.5 MCG/ACT inhaler Commonly known as:  SYMBICORT Inhale two puffs twice a day to prevent cough or wheeze.  Rinse, gargle, and spit after use.   esomeprazole 40 MG capsule Commonly known as:  NEXIUM Take 1 capsule (40 mg total) by mouth daily. Can increase to twice a day during flare-up.   GARLIC PO Take by mouth daily.   GINGER PO Take by mouth daily.   mometasone 50 MCG/ACT nasal spray Commonly known as:  NASONEX Use 1-2 sprays in each nostril once daily.   MULTIVITAMIN PO Take by mouth.   predniSONE 10 MG tablet Commonly known as:  DELTASONE TAKE 6 TABLETS X 3 DAYS, TAKE 4 TABLETS X 3 DAYS. TAKE 2 TABLETS X 3 DAYS TAKE ONE TABLET X 3 DAYS   URINOZINC PO Take by mouth 2 (two) times daily as needed.       Past Medical History:  Diagnosis Date  . Asthma   . Cluster headache   . Reflux   . Sinusitis     Past Surgical History:  Procedure Laterality Date  . bone spur removed    . KNEE ARTHROPLASTY     x 2  .  SINOSCOPY     6 SURGERIES  . tear duct replacement      Review of systems negative except as noted in HPI / PMHx or noted below:  Review of Systems  Constitutional: Negative.   HENT: Negative.   Eyes: Negative.   Respiratory: Negative.   Cardiovascular: Negative.   Gastrointestinal: Negative.   Genitourinary: Negative.   Musculoskeletal: Negative.   Skin: Negative.   Neurological: Negative.   Endo/Heme/Allergies: Negative.   Psychiatric/Behavioral: Negative.      Objective:   Vitals:   03/27/18 0957  Pulse: 68  Resp: 12  SpO2: 98%          Physical Exam  HENT:  Head: Normocephalic.  Right Ear: Tympanic membrane, external ear and ear canal normal.   Left Ear: Tympanic membrane, external ear and ear canal normal.  Nose: Nose normal. No mucosal edema or rhinorrhea.  Mouth/Throat: Uvula is midline, oropharynx is clear and moist and mucous membranes are normal. No oropharyngeal exudate.  Eyes: Conjunctivae are normal.  Neck: Trachea normal. No tracheal tenderness present. No tracheal deviation present. No thyromegaly present.  Cardiovascular: Normal rate, regular rhythm, S1 normal, S2 normal and normal heart sounds.  No murmur heard. Pulmonary/Chest: Breath sounds normal. No stridor. No respiratory distress. He has no wheezes. He has no rales.  Musculoskeletal: He exhibits no edema.  Lymphadenopathy:       Head (right side): No tonsillar adenopathy present.       Head (left side): No tonsillar adenopathy present.    He has no cervical adenopathy.  Neurological: He is alert.  Skin: No rash noted. He is not diaphoretic. No erythema. Nails show no clubbing.    Diagnostics:    Spirometry was performed and demonstrated an FEV1 of 2.60 at 70 % of predicted.  The patient had an Asthma Control Test with the following results: ACT Total Score: 12.    Assessment and Plan:   1. Asthma, not well controlled, moderate persistent, with acute exacerbation   2. Other allergic rhinitis   3. LPRD (laryngopharyngeal reflux disease)     1. Continue Symbicort 160- 2 inhalations twice a day   2. Add Qvar 80 - 2 inhalations twice a day to Symbicort during "flareup"  3. Continue Nasonex 1-2 sprays each nostril one time per day    4. Continue Nexium 40 mg one tablet once a day. Can increase to twice a day during "flareup"  5. Over-the-counter nasal saline nasal wash daily    6. If needed:   A. Ventolin HFA or similar 2 inhalations every 4-6 hours  B. Daily antihistamine   C. Mucinex DM  7.  For this most recent episode utilize the following:   A.  Azithromycin 500 mg 1 tablet 1 time per day for 3 days  B.  Prednisone 10 mg tablet 1 tablet 1  time per day for 10 days  8. Return to clinic in 6 months or earlier if problem   9. Obtain fall flu vaccine  Kyle [name] appears to have developed an infectious disease giving rise to inflammation of his airway.  His coworker still continues to cough excessively after 2 and half weeks of therapy which can suggest the possibility of pertussis or mycoplasma and I will cover him for these possibilities by giving him an azithromycin treatment.  He has already had a fair amount of systemic steroids and I will give him a low dose for the next 10 days to cover any additional inflammation.  He  has a very good understanding of how his medications work and when it is appropriate to add in his Qvar to his daily Symbicort and he will continue to treat his reflux as noted above.  Assuming he does well I will see him back in this clinic in 6 months or earlier if there is a problem.  Allena Katz, MD Allergy / Immunology [place]

## 2018-03-27 NOTE — Patient Instructions (Signed)
  1. Continue Symbicort 160- 2 inhalations twice a day   2. Add Qvar 80 - 2 inhalations twice a day to Symbicort during "flareup"  3. Continue Nasonex 1-2 sprays each nostril one time per day    4. Continue Nexium 40 mg one tablet once a day. Can increase to twice a day during "flareup"  5. Over-the-counter nasal saline nasal wash daily    6. If needed:   A. Ventolin HFA or similar 2 inhalations every 4-6 hours  B. Daily antihistamine   C. Mucinex DM  7.  For this most recent episode utilize the following:   A.  Azithromycin 500 mg 1 tablet 1 time per day for 3 days  B.  Prednisone 10 mg tablet 1 tablet 1 time per day for 10 days  8. Return to clinic in 6 months or earlier if problem   9. Obtain fall flu vaccine

## 2018-03-28 ENCOUNTER — Encounter: Payer: Self-pay | Admitting: Allergy and Immunology

## 2018-04-06 DIAGNOSIS — L820 Inflamed seborrheic keratosis: Secondary | ICD-10-CM | POA: Diagnosis not present

## 2018-04-06 DIAGNOSIS — L570 Actinic keratosis: Secondary | ICD-10-CM | POA: Diagnosis not present

## 2018-04-20 DIAGNOSIS — N486 Induration penis plastica: Secondary | ICD-10-CM | POA: Diagnosis not present

## 2018-04-20 DIAGNOSIS — N401 Enlarged prostate with lower urinary tract symptoms: Secondary | ICD-10-CM | POA: Diagnosis not present

## 2018-04-20 DIAGNOSIS — R69 Illness, unspecified: Secondary | ICD-10-CM | POA: Diagnosis not present

## 2018-04-20 DIAGNOSIS — R972 Elevated prostate specific antigen [PSA]: Secondary | ICD-10-CM | POA: Diagnosis not present

## 2018-05-01 DIAGNOSIS — N4281 Prostatodynia syndrome: Secondary | ICD-10-CM | POA: Diagnosis not present

## 2018-05-01 DIAGNOSIS — R300 Dysuria: Secondary | ICD-10-CM | POA: Diagnosis not present

## 2018-05-01 DIAGNOSIS — N410 Acute prostatitis: Secondary | ICD-10-CM | POA: Diagnosis not present

## 2018-05-08 NOTE — Progress Notes (Signed)
[facility]  PATIENT: Kyle [name] DOB: 18-Jul-1951   REASON FOR VISIT: Follow-up for history of cluster headache  HISTORY FROM: Patient    HISTORY OF PRESENT ILLNESS: Kyle [name] is a 67 year old right male, seen in refer by his ENT  Monica Becton for evaluation of headaches, his primary care is Dr.,Schultz, Lora Havens, initial evaluation was on July 11 2017  He Had past medical history of chronic sinusitis, had sinus surgery in the past, past medical history of acid reflux, hip and knee surgery, left tear duct surgery He denied previous history of headache, since June 2018, he had intermittent left facial numbness, spreading to left arm, lightheadedness, unsteadiness sensation, no vertigo, per patient, he had ENT vestibular evaluation, was told of decreased left vestibular function, but with preserved hearing at the left side, He denies visual loss, he also complains of intermittent double vision, feeling fatigued, He reported a history of now complaining with pressure-like feeling in his head and off balance cluster headache, left side retro-orbital area iced piercing headaches, lasting 20 minutes, happen every night, with associated tearing, stuffy nose. His headache responding well to Imitrex 50 mg as needed I personally reviewed MRI of the brain without contrast in August 2018, mild generalized atrophy, evidence of severe chronic sinusitis  UPDATE 6/12/2019CM Kyle [name] 67 year old male returns for follow-up with a history of cluster headache, intermittent hand paresthesias, pressure type sensation in his head.  He denies specific headache.  He denies diplopia , he describes the pressure sensation as a pulling sensation.  It does not interfere with his ability to perform activities of daily living, it does not interfere with his ability to sleep at night.  He has not had any falls.  MRI of the brain in August 2018 per Dr. Rhea Belton note mild  generalized atrophy and evidence of severe chronic sinusitis.  He returns for reevaluation UPDATE 8/27/2019CM Kyle [name], 67 year old male returns for follow-up with a history of cluster headaches that have worsened in the last 3 weeks.  His best friend of 59 years died approximately 3 weeks ago of a massive heart attack unexpectedly.  He has had headaches every day since that time.  He does take Imitrex which works.  He claims his headaches last longer than they have in the past up to 45 minutes when they use  to only be 10 to 15 minutes.  He denies any diplopia.  He is continued his normal activities in fact he is playing golf this after noon.  He is currently being treated for prostatitis.  He needs to have a hip replacement.  He returns for reevaluation.  He claims the gabapentin that was ordered at his last visit was helpful.  He is able to go to sleep at night but then he wakes up and cannot turn his mind off. REVIEW OF SYSTEMS: Full 14 system review of systems performed and notable only for those listed, all others are neg:  Constitutional: Fatigue Cardiovascular: neg Ear/Nose/Throat: neg  Skin: neg Eyes: neg Respiratory: Asthma  Gastroitestinal: neg  Hematology/Lymphatic: neg  Endocrine: neg Musculoskeletal:neg Allergy/Immunology: neg Neurological: Numbness history of cluster headaches Psychiatric: neg Sleep : Frequent waking   ALLERGIES: Allergies  Allergen Reactions  . Augmentin [Amoxicillin-Pot Clavulanate]     Could not tolerate.  Anner Crete In D5w]     HOME MEDICATIONS: Outpatient Medications Prior to Visit  Medication Sig Dispense Refill  . albuterol (PROAIR HFA) 108 (90 Base) MCG/ACT inhaler Inhale two puffs  every 4-6 hours if needed for cough or wheeze. 3 Inhaler 1  . budesonide-formoterol (SYMBICORT) 160-4.5 MCG/ACT inhaler Inhale two puffs twice a day to prevent cough or wheeze.  Rinse, gargle, and spit after use. 3 Inhaler 3  . doxycycline  (VIBRAMYCIN) 100 MG capsule TAKE ONE CAPSULE BY MOUTH TWICE DAILY FOR PROSTATITS  0  . esomeprazole (NEXIUM) 40 MG capsule Take 1 capsule (40 mg total) by mouth daily. Can increase to twice a day during flare-up. 180 capsule 3  . GARLIC PO Take by mouth daily.    . Ginger, Zingiber officinalis, (GINGER PO) Take by mouth daily.    . Misc Natural Products (URINOZINC PO) Take by mouth 2 (two) times daily as needed.     . mometasone (NASONEX) 50 MCG/ACT nasal spray Use 1-2 sprays in each nostril once daily. 51 g 3  . Multiple Vitamins-Minerals (MULTIVITAMIN PO) Take by mouth.    . SUMAtriptan (IMITREX) 100 MG tablet TAKE ONE TABLET BY MOUTH AT ONSET OF HEADACHE. MAY REPEAT IN 3 HOURS  5  . albuterol (PROVENTIL) (2.5 MG/3ML) 0.083% nebulizer solution INHALE ONE VIAL IN NEBULIZER every 4-6 hours as needed FOR WHEEZING  0  . Beclomethasone Diprop HFA (QVAR REDIHALER) 80 MCG/ACT AERB Inhale 2 Doses into the lungs 2 (two) times daily as needed (during asthma flare-up). Rinse, gargle, and spit after use. (Patient not taking: Reported on 05/09/2018) 3 Inhaler 3  . azithromycin (ZITHROMAX) 500 MG tablet Take one tablet once daily for 3 days 3 tablet 0  . fluconazole (DIFLUCAN) 150 MG tablet Take 150 mg by mouth once.    . predniSONE (DELTASONE) 10 MG tablet TAKE 6 TABLETS X 3 DAYS, TAKE 4 TABLETS X 3 DAYS. TAKE 2 TABLETS X 3 DAYS TAKE ONE TABLET X 3 DAYS  0   No facility-administered medications prior to visit.     PAST MEDICAL HISTORY: Past Medical History:  Diagnosis Date  . Asthma   . Cluster headache   . Reflux   . Sinusitis     PAST SURGICAL HISTORY: Past Surgical History:  Procedure Laterality Date  . bone spur removed    . KNEE ARTHROPLASTY     x 2  . SINOSCOPY     6 SURGERIES  . tear duct replacement      FAMILY HISTORY: Family History  Problem Relation Age of Onset  . Emphysema Mother   . Heart attack Mother   . Asthma Father   . Stroke Father     SOCIAL HISTORY: Social  History   Socioeconomic History  . Marital status: Married    Spouse name: Not on file  . Number of children: 2  . Years of education: Masters  . Highest education level: Not on file  Occupational History  . Occupation: Production assistant, radio    Comment: Retired Forensic psychologist  Social Needs  . Financial resource strain: Not on file  . Food insecurity:    Worry: Not on file    Inability: Not on file  . Transportation needs:    Medical: Not on file    Non-medical: Not on file  Tobacco Use  . Smoking status: Never Smoker  . Smokeless tobacco: Never Used  Substance and Sexual Activity  . Alcohol use: No  . Drug use: No  . Sexual activity: Not on file  Lifestyle  . Physical activity:    Days per week: Not on file    Minutes per session: Not on file  . Stress: Not on  file  Relationships  . Social connections:    Talks on phone: Not on file    Gets together: Not on file    Attends religious service: Not on file    Active member of club or organization: Not on file    Attends meetings of clubs or organizations: Not on file    Relationship status: Not on file  . Intimate partner violence:    Fear of current or ex partner: Not on file    Emotionally abused: Not on file    Physically abused: Not on file    Forced sexual activity: Not on file  Other Topics Concern  . Not on file  Social History Narrative   Lives at home with his wife.   Right-handed.   2.5 - 3 cups coffee per day.     PHYSICAL EXAM  Vitals:   05/09/18 1241  BP: (!) 165/88  Pulse: 75  Weight: 202 lb 12.8 oz (92 kg)  Height: 6' (1.829 m)   Body mass index is 27.5 kg/m.  Generalized: Well developed, in no acute distress  Head: normocephalic and atraumatic,. Oropharynx benign  Neck: Supple,   Musculoskeletal: No deformity   Neurological examination   Mentation: Alert oriented to time, place, history taking. Attention span and concentration appropriate. Recent and remote memory intact.  Follows all  commands speech and language fluent. Frequent eyebrow movements  Cranial nerve II-XII: Pupils were equal round reactive to light extraocular movements were full, visual field were full on confrontational test. Facial sensation and strength were normal. hearing was intact to finger rubbing bilaterally. Uvula tongue midline. head turning and shoulder shrug were normal and symmetric.Tongue protrusion into cheek strength was normal. Motor: normal bulk and tone, full strength in the BUE, BLE, fine finger movements normal, no pronator drift. No focal weakness Sensory: normal and symmetric to light touch, pinprick, and  Vibration in the upper and lower extremities  Coordination: finger-nose-finger, heel-to-shin bilaterally, no dysmetria Reflexes: Brachioradialis 2/2, biceps 2/2, triceps 2/2, patellar 2/2, Achilles 2/2, plantar responses were flexor bilaterally. Gait and Station: Rising up from seated position without assistance, normal stance,  moderate stride, good arm swing, smooth turning, able to perform tiptoe, and heel walking without difficulty. Tandem gait is steady Romberg negative.   DIAGNOSTIC DATA (LABS, IMAGING, TESTING) - I reviewed patient records, labs, notes, testing and imaging myself where available.  Lab Results  Component Value Date   WBC 5.1 07/11/2017   HGB 15.1 07/11/2017   HCT 45.2 07/11/2017   MCV 92 07/11/2017   PLT 224 07/11/2017      Component Value Date/Time   NA 146 (H) 07/11/2017 0900   K 5.0 07/11/2017 0900   CL 106 07/11/2017 0900   CO2 24 07/11/2017 0900   GLUCOSE 99 07/11/2017 0900   BUN 20 07/11/2017 0900   CREATININE 1.19 07/11/2017 0900   CALCIUM 9.4 07/11/2017 0900   PROT 6.6 07/11/2017 0900   ALBUMIN 4.5 07/11/2017 0900   AST 16 07/11/2017 0900   ALT 13 07/11/2017 0900   ALKPHOS 70 07/11/2017 0900   BILITOT 0.4 07/11/2017 0900   GFRNONAA 63 07/11/2017 0900   GFRAA 73 07/11/2017 0900   Lab Results  Component Value Date   CHOL 195 07/11/2017    HDL Kyle [name] 07/11/2017   LDLCALC 119 (H) 07/11/2017   TRIG 57 07/11/2017   CHOLHDL 3.0 07/11/2017    Lab Results  Component Value Date   VITAMINB12 586 07/11/2017   Lab Results  Component  Value Date   TSH 2.490 07/11/2017      ASSESSMENT AND PLANChristopher Jerilynn Mages [name] is a 67 y.o. male   with history of cluster headache which has responded well to Imitrex as needed and intermittent left facial left hand paresthesias, pressure and pulling sensation in the head. Normal MRI of the brain, normal neurological examinations, Laboratory evaluations, including acetylcholine receptor antibody, CBC CMP, sed rate, TSH CK, C-reactive protein , B12 and ANa with reflex all returned normal .  His cluster headaches restarted about 3 weeks ago when his best friend of 10 years died unexpectedly.  PLAN: Restart gabapentin 100 mg at night, watch for drowsiness in the am call in one month if the medication is beneficial we can increase in slow increments Normal neurologic exam  Recommend bereavement for your recent loss Exercise 30 min at least daily, continue to be as active as possible F/U in 3 months   Dennie Bible, [facility], [facility], [facility] [address], [place], [place] [address] [phone]

## 2018-05-09 ENCOUNTER — Encounter: Payer: Self-pay | Admitting: Nurse Practitioner

## 2018-05-09 ENCOUNTER — Ambulatory Visit (INDEPENDENT_AMBULATORY_CARE_PROVIDER_SITE_OTHER): Payer: Medicare Other | Admitting: Nurse Practitioner

## 2018-05-09 VITALS — BP 165/88 | HR 75 | Ht 72.0 in | Wt 202.8 lb

## 2018-05-09 DIAGNOSIS — R202 Paresthesia of skin: Secondary | ICD-10-CM | POA: Diagnosis not present

## 2018-05-09 DIAGNOSIS — G44019 Episodic cluster headache, not intractable: Secondary | ICD-10-CM | POA: Diagnosis not present

## 2018-05-09 MED ORDER — GABAPENTIN 100 MG PO CAPS: 100.0000 mg | ORAL_CAPSULE | Freq: Every day | ORAL | 3 refills | Status: DC

## 2018-05-09 NOTE — Patient Instructions (Signed)
Restart gabapentin 100 mg at night, watch for drowsiness in the am call in one month if the medication is beneficial we can increase in slow increments Normal neurologic exam  Recommend bereavement Exercise 30 min at least daily F/U in 3 months

## 2018-05-11 NOTE — Progress Notes (Signed)
I have reviewed and agreed above plan. 

## 2018-05-23 DIAGNOSIS — N401 Enlarged prostate with lower urinary tract symptoms: Secondary | ICD-10-CM | POA: Diagnosis not present

## 2018-05-23 DIAGNOSIS — R351 Nocturia: Secondary | ICD-10-CM | POA: Diagnosis not present

## 2018-05-23 DIAGNOSIS — N3289 Other specified disorders of bladder: Secondary | ICD-10-CM | POA: Diagnosis not present

## 2018-05-25 ENCOUNTER — Ambulatory Visit: Payer: Medicare Other | Admitting: Allergy and Immunology

## 2018-06-01 DIAGNOSIS — Z23 Encounter for immunization: Secondary | ICD-10-CM | POA: Diagnosis not present

## 2018-06-15 DIAGNOSIS — N401 Enlarged prostate with lower urinary tract symptoms: Secondary | ICD-10-CM | POA: Diagnosis not present

## 2018-06-15 DIAGNOSIS — N411 Chronic prostatitis: Secondary | ICD-10-CM | POA: Diagnosis not present

## 2018-06-22 DIAGNOSIS — Z23 Encounter for immunization: Secondary | ICD-10-CM | POA: Diagnosis not present

## 2018-07-28 DIAGNOSIS — N411 Chronic prostatitis: Secondary | ICD-10-CM | POA: Diagnosis not present

## 2018-07-28 DIAGNOSIS — N401 Enlarged prostate with lower urinary tract symptoms: Secondary | ICD-10-CM | POA: Diagnosis not present

## 2018-07-31 ENCOUNTER — Other Ambulatory Visit: Payer: Self-pay

## 2018-08-04 DIAGNOSIS — H2513 Age-related nuclear cataract, bilateral: Secondary | ICD-10-CM | POA: Diagnosis not present

## 2018-08-28 DIAGNOSIS — B349 Viral infection, unspecified: Secondary | ICD-10-CM | POA: Diagnosis not present

## 2018-08-28 DIAGNOSIS — R1110 Vomiting, unspecified: Secondary | ICD-10-CM | POA: Diagnosis not present

## 2018-08-29 NOTE — Progress Notes (Deleted)
[facility]  PATIENT: Kyle [name] DOB: 18-Jul-1951   REASON FOR VISIT: Follow-up for history of cluster headache  HISTORY FROM: Patient    HISTORY OF PRESENT ILLNESS: [name] is a 67 year old right male, seen in refer by his ENT  Monica Becton for evaluation of headaches, his primary care is Dr.,Schultz, Lora Havens, initial evaluation was on July 11 2017  He Had past medical history of chronic sinusitis, had sinus surgery in the past, past medical history of acid reflux, hip and knee surgery, left tear duct surgery He denied previous history of headache, since June 2018, he had intermittent left facial numbness, spreading to left arm, lightheadedness, unsteadiness sensation, no vertigo, per patient, he had ENT vestibular evaluation, was told of decreased left vestibular function, but with preserved hearing at the left side, He denies visual loss, he also complains of intermittent double vision, feeling fatigued, He reported a history of now complaining with pressure-like feeling in his head and off balance cluster headache, left side retro-orbital area iced piercing headaches, lasting 20 minutes, happen every night, with associated tearing, stuffy nose. His headache responding well to Imitrex 50 mg as needed I personally reviewed MRI of the brain without contrast in August 2018, mild generalized atrophy, evidence of severe chronic sinusitis  UPDATE 6/12/2019CM Mr. Bussiere 67 year old male returns for follow-up with a history of cluster headache, intermittent hand paresthesias, pressure type sensation in his head.  He denies specific headache.  He denies diplopia , he describes the pressure sensation as a pulling sensation.  It does not interfere with his ability to perform activities of daily living, it does not interfere with his ability to sleep at night.  He has not had any falls.  MRI of the brain in August 2018 per Dr. Rhea Belton note mild  generalized atrophy and evidence of severe chronic sinusitis.  He returns for reevaluation UPDATE 8/27/2019CM Mr. Grieshaber, 67 year old male returns for follow-up with a history of cluster headaches that have worsened in the last 3 weeks.  His best friend of 59 years died approximately 3 weeks ago of a massive heart attack unexpectedly.  He has had headaches every day since that time.  He does take Imitrex which works.  He claims his headaches last longer than they have in the past up to 45 minutes when they use  to only be 10 to 15 minutes.  He denies any diplopia.  He is continued his normal activities in fact he is playing golf this after noon.  He is currently being treated for prostatitis.  He needs to have a hip replacement.  He returns for reevaluation.  He claims the gabapentin that was ordered at his last visit was helpful.  He is able to go to sleep at night but then he wakes up and cannot turn his mind off. REVIEW OF SYSTEMS: Full 14 system review of systems performed and notable only for those listed, all others are neg:  Constitutional: Fatigue Cardiovascular: neg Ear/Nose/Throat: neg  Skin: neg Eyes: neg Respiratory: Asthma  Gastroitestinal: neg  Hematology/Lymphatic: neg  Endocrine: neg Musculoskeletal:neg Allergy/Immunology: neg Neurological: Numbness history of cluster headaches Psychiatric: neg Sleep : Frequent waking   ALLERGIES: Allergies  Allergen Reactions  . Augmentin [Amoxicillin-Pot Clavulanate]     Could not tolerate.  Anner Crete In D5w]     HOME MEDICATIONS: Outpatient Medications Prior to Visit  Medication Sig Dispense Refill  . albuterol (PROAIR HFA) 108 (90 Base) MCG/ACT inhaler Inhale two puffs  every 4-6 hours if needed for cough or wheeze. 3 Inhaler 1  . albuterol (PROVENTIL) (2.5 MG/3ML) 0.083% nebulizer solution INHALE ONE VIAL IN NEBULIZER every 4-6 hours as needed FOR WHEEZING  0  . Beclomethasone Diprop HFA (QVAR REDIHALER) 80 MCG/ACT  AERB Inhale 2 Doses into the lungs 2 (two) times daily as needed (during asthma flare-up). Rinse, gargle, and spit after use. (Patient not taking: Reported on 05/09/2018) 3 Inhaler 3  . budesonide-formoterol (SYMBICORT) 160-4.5 MCG/ACT inhaler Inhale two puffs twice a day to prevent cough or wheeze.  Rinse, gargle, and spit after use. 3 Inhaler 3  . doxycycline (VIBRAMYCIN) 100 MG capsule TAKE ONE CAPSULE BY MOUTH TWICE DAILY FOR PROSTATITS  0  . esomeprazole (NEXIUM) 40 MG capsule Take 1 capsule (40 mg total) by mouth daily. Can increase to twice a day during flare-up. 180 capsule 3  . gabapentin (NEURONTIN) 100 MG capsule Take 1 capsule (100 mg total) by mouth at bedtime. 30 capsule 3  . GARLIC PO Take by mouth daily.    . Ginger, Zingiber officinalis, (GINGER PO) Take by mouth daily.    . Misc Natural Products (URINOZINC PO) Take by mouth 2 (two) times daily as needed.     . mometasone (NASONEX) 50 MCG/ACT nasal spray Use 1-2 sprays in each nostril once daily. 51 g 3  . Multiple Vitamins-Minerals (MULTIVITAMIN PO) Take by mouth.    . SUMAtriptan (IMITREX) 100 MG tablet TAKE ONE TABLET BY MOUTH AT ONSET OF HEADACHE. MAY REPEAT IN 3 HOURS  5   No facility-administered medications prior to visit.     PAST MEDICAL HISTORY: Past Medical History:  Diagnosis Date  . Asthma   . Cluster headache   . Reflux   . Sinusitis     PAST SURGICAL HISTORY: Past Surgical History:  Procedure Laterality Date  . bone spur removed    . KNEE ARTHROPLASTY     x 2  . SINOSCOPY     6 SURGERIES  . tear duct replacement      FAMILY HISTORY: Family History  Problem Relation Age of Onset  . Emphysema Mother   . Heart attack Mother   . Asthma Father   . Stroke Father     SOCIAL HISTORY: Social History   Socioeconomic History  . Marital status: Married    Spouse name: Not on file  . Number of children: 2  . Years of education: Masters  . Highest education level: Not on file  Occupational History   . Occupation: Production assistant, radio    Comment: Retired Forensic psychologist  Social Needs  . Financial resource strain: Not on file  . Food insecurity:    Worry: Not on file    Inability: Not on file  . Transportation needs:    Medical: Not on file    Non-medical: Not on file  Tobacco Use  . Smoking status: Never Smoker  . Smokeless tobacco: Never Used  Substance and Sexual Activity  . Alcohol use: No  . Drug use: No  . Sexual activity: Not on file  Lifestyle  . Physical activity:    Days per week: Not on file    Minutes per session: Not on file  . Stress: Not on file  Relationships  . Social connections:    Talks on phone: Not on file    Gets together: Not on file    Attends religious service: Not on file    Active member of club or organization: Not on file  Attends meetings of clubs or organizations: Not on file    Relationship status: Not on file  . Intimate partner violence:    Fear of current or ex partner: Not on file    Emotionally abused: Not on file    Physically abused: Not on file    Forced sexual activity: Not on file  Other Topics Concern  . Not on file  Social History Narrative   Lives at home with his wife.   Right-handed.   2.5 - 3 cups coffee per day.     PHYSICAL EXAM  There were no vitals filed for this visit. There is no height or weight on file to calculate BMI.  Generalized: Well developed, in no acute distress  Head: normocephalic and atraumatic,. Oropharynx benign  Neck: Supple,   Musculoskeletal: No deformity   Neurological examination   Mentation: Alert oriented to time, place, history taking. Attention span and concentration appropriate. Recent and remote memory intact.  Follows all commands speech and language fluent. Frequent eyebrow movements  Cranial nerve II-XII: Pupils were equal round reactive to light extraocular movements were full, visual field were full on confrontational test. Facial sensation and strength were normal.  hearing was intact to finger rubbing bilaterally. Uvula tongue midline. head turning and shoulder shrug were normal and symmetric.Tongue protrusion into cheek strength was normal. Motor: normal bulk and tone, full strength in the BUE, BLE, fine finger movements normal, no pronator drift. No focal weakness Sensory: normal and symmetric to light touch, pinprick, and  Vibration in the upper and lower extremities  Coordination: finger-nose-finger, heel-to-shin bilaterally, no dysmetria Reflexes: Brachioradialis 2/2, biceps 2/2, triceps 2/2, patellar 2/2, Achilles 2/2, plantar responses were flexor bilaterally. Gait and Station: Rising up from seated position without assistance, normal stance,  moderate stride, good arm swing, smooth turning, able to perform tiptoe, and heel walking without difficulty. Tandem gait is steady Romberg negative.   DIAGNOSTIC DATA (LABS, IMAGING, TESTING) - I reviewed patient records, labs, notes, testing and imaging myself where available.  Lab Results  Component Value Date   WBC 5.1 07/11/2017   HGB 15.1 07/11/2017   HCT 45.2 07/11/2017   MCV 92 07/11/2017   PLT 224 07/11/2017      Component Value Date/Time   NA 146 (H) 07/11/2017 0900   K 5.0 07/11/2017 0900   CL 106 07/11/2017 0900   CO2 24 07/11/2017 0900   GLUCOSE 99 07/11/2017 0900   BUN 20 07/11/2017 0900   CREATININE 1.19 07/11/2017 0900   CALCIUM 9.4 07/11/2017 0900   PROT 6.6 07/11/2017 0900   ALBUMIN 4.5 07/11/2017 0900   AST 16 07/11/2017 0900   ALT 13 07/11/2017 0900   ALKPHOS 70 07/11/2017 0900   BILITOT 0.4 07/11/2017 0900   GFRNONAA 63 07/11/2017 0900   GFRAA 73 07/11/2017 0900   Lab Results  Component Value Date   CHOL 195 07/11/2017   HDL 65 07/11/2017   LDLCALC 119 (H) 07/11/2017   TRIG 57 07/11/2017   CHOLHDL 3.0 07/11/2017    Lab Results  Component Value Date   [id number] 935 07/11/2017   Lab Results  Component Value Date   TSH 2.490 07/11/2017      ASSESSMENT AND  PLANChristopher M Ferrando is a 67 y.o. male   with history of cluster headache which has responded well to Imitrex as needed and intermittent left facial left hand paresthesias, pressure and pulling sensation in the head. Normal MRI of the brain, normal neurological examinations, Laboratory evaluations, including  acetylcholine receptor antibody, CBC CMP, sed rate, TSH CK, C-reactive protein , B12 and ANa with reflex all returned normal .  His cluster headaches restarted about 3 weeks ago when his best friend of 84 years died unexpectedly.  PLAN: Restart gabapentin 100 mg at night, watch for drowsiness in the am call in one month if the medication is beneficial we can increase in slow increments Normal neurologic exam  Recommend bereavement for your recent loss Exercise 30 min at least daily, continue to be as active as possible F/U in 3 months   Dennie Bible, [facility], [facility], [facility] [address] [place], [place] [address] [phone]

## 2018-08-30 ENCOUNTER — Ambulatory Visit: Payer: Medicare Other | Admitting: Nurse Practitioner

## 2018-09-01 DIAGNOSIS — L219 Seborrheic dermatitis, unspecified: Secondary | ICD-10-CM | POA: Diagnosis not present

## 2018-09-13 DIAGNOSIS — C61 Malignant neoplasm of prostate: Secondary | ICD-10-CM

## 2018-09-13 HISTORY — DX: Malignant neoplasm of prostate: C61

## 2018-09-26 DIAGNOSIS — H6591 Unspecified nonsuppurative otitis media, right ear: Secondary | ICD-10-CM | POA: Diagnosis not present

## 2018-10-13 DIAGNOSIS — H6981 Other specified disorders of Eustachian tube, right ear: Secondary | ICD-10-CM

## 2018-10-13 DIAGNOSIS — H6591 Unspecified nonsuppurative otitis media, right ear: Secondary | ICD-10-CM

## 2018-10-13 DIAGNOSIS — J329 Chronic sinusitis, unspecified: Secondary | ICD-10-CM | POA: Insufficient documentation

## 2018-10-13 DIAGNOSIS — H6991 Unspecified Eustachian tube disorder, right ear: Secondary | ICD-10-CM

## 2018-10-13 HISTORY — DX: Other specified disorders of eustachian tube, right ear: H69.81

## 2018-10-13 HISTORY — DX: Unspecified nonsuppurative otitis media, right ear: H65.91

## 2018-10-13 HISTORY — DX: Unspecified eustachian tube disorder, right ear: H69.91

## 2018-10-16 DIAGNOSIS — N401 Enlarged prostate with lower urinary tract symptoms: Secondary | ICD-10-CM | POA: Diagnosis not present

## 2018-10-16 DIAGNOSIS — N411 Chronic prostatitis: Secondary | ICD-10-CM | POA: Diagnosis not present

## 2018-10-19 ENCOUNTER — Other Ambulatory Visit: Payer: Self-pay | Admitting: Allergy and Immunology

## 2018-10-26 DIAGNOSIS — D2372 Other benign neoplasm of skin of left lower limb, including hip: Secondary | ICD-10-CM | POA: Diagnosis not present

## 2018-10-26 DIAGNOSIS — L219 Seborrheic dermatitis, unspecified: Secondary | ICD-10-CM | POA: Diagnosis not present

## 2018-10-26 DIAGNOSIS — L570 Actinic keratosis: Secondary | ICD-10-CM | POA: Diagnosis not present

## 2018-11-10 DIAGNOSIS — H938X3 Other specified disorders of ear, bilateral: Secondary | ICD-10-CM | POA: Diagnosis not present

## 2018-11-10 DIAGNOSIS — Z8709 Personal history of other diseases of the respiratory system: Secondary | ICD-10-CM | POA: Diagnosis not present

## 2018-11-13 DIAGNOSIS — R972 Elevated prostate specific antigen [PSA]: Secondary | ICD-10-CM | POA: Diagnosis not present

## 2018-11-14 DIAGNOSIS — Z0000 Encounter for general adult medical examination without abnormal findings: Secondary | ICD-10-CM | POA: Diagnosis not present

## 2018-11-14 DIAGNOSIS — Z9181 History of falling: Secondary | ICD-10-CM | POA: Diagnosis not present

## 2018-11-14 DIAGNOSIS — E785 Hyperlipidemia, unspecified: Secondary | ICD-10-CM | POA: Diagnosis not present

## 2018-11-14 DIAGNOSIS — Z136 Encounter for screening for cardiovascular disorders: Secondary | ICD-10-CM | POA: Diagnosis not present

## 2018-11-14 DIAGNOSIS — Z1331 Encounter for screening for depression: Secondary | ICD-10-CM | POA: Diagnosis not present

## 2018-11-14 DIAGNOSIS — Z125 Encounter for screening for malignant neoplasm of prostate: Secondary | ICD-10-CM | POA: Diagnosis not present

## 2018-11-22 ENCOUNTER — Other Ambulatory Visit: Payer: Self-pay | Admitting: *Deleted

## 2018-11-22 MED ORDER — ESOMEPRAZOLE MAGNESIUM 40 MG PO CPDR: 40.0000 mg | DELAYED_RELEASE_CAPSULE | Freq: Every day | ORAL | 0 refills | Status: DC

## 2018-11-22 NOTE — Telephone Encounter (Signed)
Patient wife called and requested refill generic nexium. One refill sent to [facility] and advised her that patient needs appt

## 2018-11-29 DIAGNOSIS — N401 Enlarged prostate with lower urinary tract symptoms: Secondary | ICD-10-CM | POA: Diagnosis not present

## 2018-11-29 DIAGNOSIS — R972 Elevated prostate specific antigen [PSA]: Secondary | ICD-10-CM | POA: Diagnosis not present

## 2018-11-29 DIAGNOSIS — C61 Malignant neoplasm of prostate: Secondary | ICD-10-CM | POA: Diagnosis not present

## 2018-12-06 DIAGNOSIS — C61 Malignant neoplasm of prostate: Secondary | ICD-10-CM | POA: Diagnosis not present

## 2018-12-11 DIAGNOSIS — C61 Malignant neoplasm of prostate: Secondary | ICD-10-CM | POA: Diagnosis not present

## 2018-12-14 ENCOUNTER — Ambulatory Visit (INDEPENDENT_AMBULATORY_CARE_PROVIDER_SITE_OTHER): Payer: Medicare Other | Admitting: Allergy and Immunology

## 2018-12-14 ENCOUNTER — Other Ambulatory Visit: Payer: Self-pay

## 2018-12-14 ENCOUNTER — Encounter: Payer: Self-pay | Admitting: Allergy and Immunology

## 2018-12-14 VITALS — BP 132/82 | HR 72 | Temp 98.0°F | Resp 16

## 2018-12-14 DIAGNOSIS — J479 Bronchiectasis, uncomplicated: Secondary | ICD-10-CM

## 2018-12-14 DIAGNOSIS — J4541 Moderate persistent asthma with (acute) exacerbation: Secondary | ICD-10-CM | POA: Diagnosis not present

## 2018-12-14 DIAGNOSIS — K219 Gastro-esophageal reflux disease without esophagitis: Secondary | ICD-10-CM

## 2018-12-14 DIAGNOSIS — J3089 Other allergic rhinitis: Secondary | ICD-10-CM

## 2018-12-14 MED ORDER — METHYLPREDNISOLONE ACETATE 80 MG/ML IJ SUSP
80.0000 mg | Freq: Once | INTRAMUSCULAR | Status: AC
  Administered 2018-12-14: 80 mg via INTRAMUSCULAR

## 2018-12-14 MED ORDER — BECLOMETHASONE DIPROP HFA 80 MCG/ACT IN AERB: INHALATION_SPRAY | RESPIRATORY_TRACT | 1 refills | Status: DC

## 2018-12-14 MED ORDER — ALBUTEROL SULFATE HFA 108 (90 BASE) MCG/ACT IN AERS: INHALATION_SPRAY | RESPIRATORY_TRACT | 0 refills | Status: DC

## 2018-12-14 MED ORDER — ESOMEPRAZOLE MAGNESIUM 40 MG PO CPDR: 40.0000 mg | DELAYED_RELEASE_CAPSULE | Freq: Every day | ORAL | 1 refills | Status: DC

## 2018-12-14 MED ORDER — BUDESONIDE-FORMOTEROL FUMARATE 160-4.5 MCG/ACT IN AERO: INHALATION_SPRAY | RESPIRATORY_TRACT | 1 refills | Status: DC

## 2018-12-14 MED ORDER — MOMETASONE FUROATE 50 MCG/ACT NA SUSP: NASAL | 3 refills | Status: DC

## 2018-12-14 NOTE — Patient Instructions (Addendum)
  1. Continue Symbicort 160- 2 inhalations twice a day   2. Add Qvar 80 - 2 inhalations twice a day to Symbicort during "flareup"  3. Continue Nasonex 1-2 sprays each nostril one time per day    4. Continue Nexium 40 mg one tablet once a day. Can increase to twice a day during "flareup"   5. Over-the-counter nasal saline nasal wash daily    6. If needed:   A. Ventolin HFA or similar 2 inhalations every 4-6 hours  B. Daily antihistamine   C. Mucinex DM  7. Depomedrol 80 IM delivered in clinic today  8. Flutter valve   9. Blood - Alpha-1-AT level and phenotype, IgA/G/M, IgE, Aspergillus precipitants, anti-pneumococcal antobodies, CBC w/diff, CF genetic mutation (23)  9. Return to clinic in 6 months or earlier if problem

## 2018-12-14 NOTE — Progress Notes (Signed)
[facility]   Follow-up Note  Referring Provider: Nicoletta Dress, MD Primary Provider: Nicoletta Dress, MD Date of Office Visit: 12/14/2018  Subjective:   Kyle [name] (DOB: 1951/07/08) is a 68 y.o. male who returns to the [facility] on 12/14/2018 in re-evaluation of the following:  HPI: Kyle [name] returns to this clinic in reevaluation of his recurrent respiratory tract symptoms manifested as asthma and chronic sinusitis and allergic rhinitis and reflux induced respiratory disease.  His last visit to this clinic was 27 March 2018.  Overall he has had an excellent year and it does not sound as though he has required a systemic steroid or an antibiotic for a respiratory tract issue.  He did require an antibiotic for an episode of otitis media but that was only on one occasion.  Rarely does he use a short acting bronchodilator while he continues to use a collection of anti-inflammatory agents for his airway.  He can exercise without any difficulty.  However, for the past 2 weeks he has had some runny nose and some sneezing and a little bit of slight cough and he has had to use a short acting bronchodilator he initiated the use of Qvar in addition to his chronic Symbicort use.  He has not had any fever or ugly nasal discharge or ugly sputum production or chest pain or other respiratory tract symptoms.  Reflux has been going relatively well as has his throat issue while using a proton pump inhibitor.  He did obtain a flu vaccine this year.  He had investigation for prostate cancer and had a CT scan of his abdomen which illuminated his lower airways which identified bronchiectasis.  Allergies as of 12/14/2018      Reactions   Augmentin [amoxicillin-pot Clavulanate]    Could not tolerate.   Levaquin [levofloxacin In D5w]       Medication List      albuterol (2.5 MG/3ML) 0.083% nebulizer solution Commonly known as:   PROVENTIL INHALE ONE VIAL IN NEBULIZER every 4-6 hours as needed FOR WHEEZING   albuterol 108 (90 Base) MCG/ACT inhaler Commonly known as:  ProAir HFA Inhale two puffs every 4-6 hours if needed for cough or wheeze.   beclomethasone 80 MCG/ACT inhaler Commonly known as:  Qvar RediHaler Inhale two doses twice daily during asthma flare-up.  Rinse, gargle, and spit after use.   budesonide-formoterol 160-4.5 MCG/ACT inhaler Commonly known as:  Symbicort USE 2 INHALATIONS ORALLY TWICE DAILY TO PREVENT COUGH OR WHEEZE. RINSE, GARGLE AND SPIT AFTER USE   esomeprazole 40 MG capsule Commonly known as:  NexIUM Take 1 capsule (40 mg total) by mouth daily. Can increase to twice a day during flare-up.   GARLIC PO Take by mouth daily.   GINGER PO Take by mouth daily.   mometasone 50 MCG/ACT nasal spray Commonly known as:  NASONEX Use 1-2 sprays in each nostril once daily.   MULTIVITAMIN PO Take by mouth.   SUMAtriptan 100 MG tablet Commonly known as:  IMITREX TAKE ONE TABLET BY MOUTH AT ONSET OF HEADACHE. MAY REPEAT IN 3 HOURS       Past Medical History:  Diagnosis Date  . Asthma   . Cluster headache   . Prostate cancer ([place]) 2020  . Reflux   . Sinusitis     Past Surgical History:  Procedure Laterality Date  . bone spur removed    . KNEE ARTHROPLASTY  x 2  . SINOSCOPY     6 SURGERIES  . tear duct replacement      Review of systems negative except as noted in HPI / PMHx or noted below:  Review of Systems  Constitutional: Negative.   HENT: Negative.   Eyes: Negative.   Respiratory: Negative.   Cardiovascular: Negative.   Gastrointestinal: Negative.   Genitourinary: Negative.   Musculoskeletal: Negative.   Skin: Negative.   Neurological: Negative.   Endo/Heme/Allergies: Negative.   Psychiatric/Behavioral: Negative.      Objective:   Vitals:   12/14/18 0843  BP: 132/82  Pulse: 72  Resp: 16  Temp: 98 F (36.7 C)          Physical Exam  Constitutional:      Appearance: He is not diaphoretic.  HENT:     Head: Normocephalic.     Right Ear: Tympanic membrane, ear canal and external ear normal.     Left Ear: Tympanic membrane, ear canal and external ear normal.     Nose: Nose normal. No mucosal edema or rhinorrhea.     Mouth/Throat:     Pharynx: Uvula midline. No oropharyngeal exudate.  Eyes:     Conjunctiva/sclera: Conjunctivae normal.  Neck:     Thyroid: No thyromegaly.     Trachea: Trachea normal. No tracheal tenderness or tracheal deviation.  Cardiovascular:     Rate and Rhythm: Normal rate and regular rhythm.     Heart sounds: Normal heart sounds, S1 normal and S2 normal. No murmur.  Pulmonary:     Effort: No respiratory distress.     Breath sounds: Normal breath sounds. No stridor. No wheezing or rales.  Lymphadenopathy:     Head:     Right side of head: No tonsillar adenopathy.     Left side of head: No tonsillar adenopathy.     Cervical: No cervical adenopathy.  Skin:    Findings: No erythema or rash.     Nails: There is no clubbing.   Neurological:     Mental Status: He is alert.     Diagnostics:    Spirometry was performed and demonstrated an FEV1 of 2.29 at 62 % of predicted.   Results of a CT scan abdomen and pelvis obtained 07 December 2018 identified bilateral areas of bronchiectasis with mucoid impaction within the right lower lobe, left lower lobe, and medial right middle lobe without any airspace consolidation.  Assessment and Plan:   1. Asthma, not well controlled, moderate persistent, with acute exacerbation   2. Other allergic rhinitis   3. LPRD (laryngopharyngeal reflux disease)   4. Bronchiectasis without complication ([place])     1. Continue Symbicort 160- 2 inhalations twice a day   2. Add Qvar 80 - 2 inhalations twice a day to Symbicort during "flareup"  3. Continue Nasonex 1-2 sprays each nostril one time per day    4. Continue Nexium 40 mg one tablet once a day. Can increase to  twice a day during "flareup"   5. Over-the-counter nasal saline nasal wash daily    6. If needed:   A. Ventolin HFA or similar 2 inhalations every 4-6 hours  B. Daily antihistamine   C. Mucinex DM  7. Depomedrol 80 IM delivered in clinic today  8. Flutter valve   9. Blood - Alpha-1-AT level and phenotype, IgA/G/M, IgE, Aspergillus precipitants, anti-pneumococcal antobodies, CBC w/diff, CF genetic mutation (23)  9. Return to clinic in 6 months or earlier if problem   Kyle [name] appears  to be having some issues since the spring is arrived with both his upper and lower airways and I have given him a systemic steroid to help with the inflammation present in his organ systems as a result of his pollen exposure.  He will remain on anti-inflammatory agents for both his upper lower airways as noted above and also continue to treat his reflux.  He does have bronchiectasis and he has had some form of therapy in the past for multiple respiratory tract issues although that has been may be a decade or greater ago.  We will screen his blood for some of the more common causes of bronchiectasis as noted above.  And I have given him a flutter valve to help with mucus impaction of his bronchi.  I will see him back in this clinic in 6 months or earlier if there is a problem.  Allena Katz, MD Allergy / Immunology [place]

## 2018-12-20 ENCOUNTER — Other Ambulatory Visit: Payer: Self-pay

## 2018-12-20 ENCOUNTER — Ambulatory Visit (INDEPENDENT_AMBULATORY_CARE_PROVIDER_SITE_OTHER): Payer: Medicare Other | Admitting: Allergy and Immunology

## 2018-12-20 ENCOUNTER — Encounter: Payer: Self-pay | Admitting: Allergy and Immunology

## 2018-12-20 VITALS — HR 74 | Resp 16

## 2018-12-20 DIAGNOSIS — J3089 Other allergic rhinitis: Secondary | ICD-10-CM | POA: Diagnosis not present

## 2018-12-20 DIAGNOSIS — C61 Malignant neoplasm of prostate: Secondary | ICD-10-CM | POA: Diagnosis not present

## 2018-12-20 DIAGNOSIS — J4550 Severe persistent asthma, uncomplicated: Secondary | ICD-10-CM

## 2018-12-20 DIAGNOSIS — J479 Bronchiectasis, uncomplicated: Secondary | ICD-10-CM | POA: Diagnosis not present

## 2018-12-20 DIAGNOSIS — D721 Eosinophilia, unspecified: Secondary | ICD-10-CM

## 2018-12-20 DIAGNOSIS — J4551 Severe persistent asthma with (acute) exacerbation: Secondary | ICD-10-CM | POA: Diagnosis not present

## 2018-12-20 DIAGNOSIS — K219 Gastro-esophageal reflux disease without esophagitis: Secondary | ICD-10-CM | POA: Diagnosis not present

## 2018-12-20 DIAGNOSIS — Z8709 Personal history of other diseases of the respiratory system: Secondary | ICD-10-CM | POA: Diagnosis not present

## 2018-12-20 MED ORDER — EPINEPHRINE 0.3 MG/0.3ML IJ SOAJ: INTRAMUSCULAR | 3 refills | Status: DC

## 2018-12-20 MED ORDER — BENRALIZUMAB 30 MG/ML ~~LOC~~ SOSY
30.0000 mg | PREFILLED_SYRINGE | SUBCUTANEOUS | Status: DC
  Administered 2018-12-20 – 2019-02-26 (×3): 30 mg via SUBCUTANEOUS

## 2018-12-20 NOTE — Patient Instructions (Addendum)
  1. Continue Symbicort 160- 2 inhalations twice a day   2. Add Qvar 80 - 2 inhalations twice a day to Symbicort during "flareup"  3. Continue Nasonex 1-2 sprays each nostril one time per day    4. Continue Nexium 40 mg one tablet once a day. Can increase to twice a day during "flareup"  5. Continue flutter valve   6. Over-the-counter nasal saline nasal wash daily    7. If needed:   A. Ventolin HFA or similar 2 inhalations every 4-6 hours  B. Daily antihistamine   C. Mucinex DM  7.  Benralizumab injection today and every 4 weeks  8. Return to clinic in 6 months or earlier if problem

## 2018-12-20 NOTE — Progress Notes (Signed)
[facility]   Follow-up Note  Referring Provider: Nicoletta Dress, MD Primary Provider: Nicoletta Dress, MD Date of Office Visit: 12/20/2018  Subjective:   Kyle [name] (DOB: 07/24/1951) is a 68 y.o. male who returns to the [facility] on 12/20/2018 in re-evaluation of the following:  HPI: Kyle [name] returns to this clinic in reevaluation of his uncontrolled asthma and history of chronic sinusitis and allergic rhinitis and reflux induced respiratory disease and bronchiectasis.  His last visit to this clinic was 14 December 2018.  Once again he has redeveloped very significant inflammation of his airway that has not responded adequately to systemic steroids administered during his last visit.  He still continues to have coughing and chest congestion and wheezing and shortness of breath and has been using his bronchodilator even in the face of using a combination of Symbicort and Qvar.  At this point he thinks his reflux is under very good control.  Allergies as of 12/20/2018      Reactions   Augmentin [amoxicillin-pot Clavulanate]    Could not tolerate.   Levaquin [levofloxacin In D5w]       Medication List      albuterol (2.5 MG/3ML) 0.083% nebulizer solution Commonly known as:  PROVENTIL INHALE ONE VIAL IN NEBULIZER every 4-6 hours as needed FOR WHEEZING   albuterol 108 (90 Base) MCG/ACT inhaler Commonly known as:  ProAir HFA Inhale two puffs every 4-6 hours if needed for cough or wheeze.   beclomethasone 80 MCG/ACT inhaler Commonly known as:  Qvar RediHaler Inhale two doses twice daily during asthma flare-up.  Rinse, gargle, and spit after use.   budesonide-formoterol 160-4.5 MCG/ACT inhaler Commonly known as:  Symbicort USE 2 INHALATIONS ORALLY TWICE DAILY TO PREVENT COUGH OR WHEEZE. RINSE, GARGLE AND SPIT AFTER USE   esomeprazole 40 MG capsule Commonly known as:  NexIUM Take 1 capsule (40 mg total) by  mouth daily. Can increase to twice a day during flare-up.   GARLIC PO Take by mouth daily.   GINGER PO Take by mouth daily.   mometasone 50 MCG/ACT nasal spray Commonly known as:  NASONEX Use 1-2 sprays in each nostril once daily.   MULTIVITAMIN PO Take by mouth.   SUMAtriptan 100 MG tablet Commonly known as:  IMITREX TAKE ONE TABLET BY MOUTH AT ONSET OF HEADACHE. MAY REPEAT IN 3 HOURS       Past Medical History:  Diagnosis Date  . Asthma   . Cluster headache   . Prostate cancer ([place]) 2020  . Reflux   . Sinusitis     Past Surgical History:  Procedure Laterality Date  . bone spur removed    . KNEE ARTHROPLASTY     x 2  . SINOSCOPY     6 SURGERIES  . tear duct replacement      Review of systems negative except as noted in HPI / PMHx or noted below:  Review of Systems  Constitutional: Negative.   HENT: Negative.   Eyes: Negative.   Respiratory: Negative.   Cardiovascular: Negative.   Gastrointestinal: Negative.   Genitourinary: Negative.   Musculoskeletal: Negative.   Skin: Negative.   Neurological: Negative.   Endo/Heme/Allergies: Negative.   Psychiatric/Behavioral: Negative.      Objective:   Vitals:   12/20/18 0913  Pulse: 74  Resp: 16  SpO2: 97%          Physical Exam Constitutional:  Appearance: He is not diaphoretic.  HENT:     Head: Normocephalic.     Right Ear: Tympanic membrane, ear canal and external ear normal.     Left Ear: Tympanic membrane, ear canal and external ear normal.     Nose: Nose normal. No mucosal edema or rhinorrhea.     Mouth/Throat:     Pharynx: Uvula midline. No oropharyngeal exudate.  Eyes:     Conjunctiva/sclera: Conjunctivae normal.  Neck:     Thyroid: No thyromegaly.     Trachea: Trachea normal. No tracheal tenderness or tracheal deviation.  Cardiovascular:     Rate and Rhythm: Normal rate and regular rhythm.     Heart sounds: Normal heart sounds, S1 normal and S2 normal. No murmur.  Pulmonary:      Effort: No respiratory distress.     Breath sounds: Normal breath sounds. No stridor. No wheezing or rales.  Lymphadenopathy:     Head:     Right side of head: No tonsillar adenopathy.     Left side of head: No tonsillar adenopathy.     Cervical: No cervical adenopathy.  Skin:    Findings: No erythema or rash.     Nails: There is no clubbing.   Neurological:     Mental Status: He is alert.     Diagnostics:    Spirometry was performed and demonstrated an FEV1 of 2.95 at 82 % of predicted.  Results of blood tests obtained 14 December 2018 identified IgG 710 mg/DL, IgM 85 mg/DL, IgA 692 mg/DL, IgE 15 KU/L, WBC 10.0, absolute eosinophil 1100, absolute lymphocyte 2000, hemoglobin 15.2, platelet 207, alpha-1 antitrypsin phenotype MM with a level of 133 mg/DL, negative aspergillus antibodies, pending CF screen, pending anti-pneumococcal antibodies.  Assessment and Plan:   1. Asthma, not well controlled, severe persistent, with acute exacerbation   2. Bronchiectasis without complication ([place])   3. Eosinophilia   4. Other allergic rhinitis   5. History of chronic sinusitis   6. LPRD (laryngopharyngeal reflux disease)   7. Severe persistent asthma without complication     1. Continue Symbicort 160- 2 inhalations twice a day   2. Add Qvar 80 - 2 inhalations twice a day to Symbicort during "flareup"  3. Continue Nasonex 1-2 sprays each nostril one time per day    4. Continue Nexium 40 mg one tablet once a day. Can increase to twice a day during "flareup"  5. Continue flutter valve   6. Over-the-counter nasal saline nasal wash daily    7. If needed:   A. Ventolin HFA or similar 2 inhalations every 4-6 hours  B. Daily antihistamine   C. Mucinex DM  7.  Benralizumab injection today and every 4 weeks  8. Return to clinic in 6 months or earlier if problem   Even in the face of consistently using anti-inflammatory agents for his respiratory tract Kyle [name] continues to have problems  with recurrent asthma and given his recent CT scan identifying very significant mucoid impaction of his airway and his eosinophilia I think he would be best served by using an anti-[place]-5 biological agent to address this issue.  I suspect that he will have an excellent response to this agent and most of his other medications utilized in the treatment of respiratory tract inflammation will probably be eliminated  Allena Katz, MD Allergy / [place]

## 2018-12-21 ENCOUNTER — Encounter: Payer: Self-pay | Admitting: Allergy and Immunology

## 2018-12-23 LAB — CBC WITH DIFFERENTIAL/PLATELET
Basophils Absolute: 0.1 10*3/uL (ref 0.0–0.2)
Basos: 1 %
EOS (ABSOLUTE): 1.1 10*3/uL — ABNORMAL HIGH (ref 0.0–0.4)
Eos: 11 %
Hematocrit: 44.2 % (ref 37.5–51.0)
Hemoglobin: 15.2 g/dL (ref 13.0–17.7)
Lymphocytes Absolute: 2 10*3/uL (ref 0.7–3.1)
Lymphs: 20 %
MCH: 29.7 pg (ref 26.6–33.0)
MCHC: 34.4 g/dL (ref 31.5–35.7)
MCV: 87 fL (ref 79–97)
Monocytes Absolute: 0.6 10*3/uL (ref 0.1–0.9)
Monocytes: 6 %
Neutrophils Absolute: 6.2 10*3/uL (ref 1.4–7.0)
Neutrophils: 62 %
Platelets: 207 10*3/uL (ref 150–450)
RBC: 5.11 x10E6/uL (ref 4.14–5.80)
RDW: 13.6 % (ref 11.6–15.4)
WBC: 10 10*3/uL (ref 3.4–10.8)

## 2018-12-23 LAB — STREP PNEUMONIAE 23 SEROTYPES IGG
Pneumo Ab Type 1*: 0.6 ug/mL — ABNORMAL LOW (ref >1.3)
Pneumo Ab Type 12 (12F)*: <0.1 ug/mL — ABNORMAL LOW (ref >1.3)
Pneumo Ab Type 14*: 0.9 ug/mL — ABNORMAL LOW (ref >1.3)
Pneumo Ab Type 17 (17F)*: 1.6 ug/mL (ref >1.3)
Pneumo Ab Type 19 (19F)*: 3 ug/mL (ref >1.3)
Pneumo Ab Type 2*: <0.1 ug/mL — ABNORMAL LOW (ref >1.3)
Pneumo Ab Type 20*: 1.4 ug/mL (ref >1.3)
Pneumo Ab Type 22 (22F)*: <0.1 ug/mL — ABNORMAL LOW (ref >1.3)
Pneumo Ab Type 23 (23F)*: <0.1 ug/mL — ABNORMAL LOW (ref >1.3)
Pneumo Ab Type 26 (6B)*: 0.3 ug/mL — ABNORMAL LOW (ref >1.3)
Pneumo Ab Type 3*: 3.5 ug/mL (ref >1.3)
Pneumo Ab Type 34 (10A)*: 0.5 ug/mL — ABNORMAL LOW (ref >1.3)
Pneumo Ab Type 4*: 0.3 ug/mL — ABNORMAL LOW (ref >1.3)
Pneumo Ab Type 43 (11A)*: 1.4 ug/mL (ref >1.3)
Pneumo Ab Type 5*: 0.6 ug/mL — ABNORMAL LOW (ref >1.3)
Pneumo Ab Type 51 (7F)*: 0.7 ug/mL — ABNORMAL LOW (ref >1.3)
Pneumo Ab Type 54 (15B)*: 0.2 ug/mL — ABNORMAL LOW (ref >1.3)
Pneumo Ab Type 56 (18C)*: >16.7 ug/mL (ref >1.3)
Pneumo Ab Type 57 (19A)*: 6.1 ug/mL (ref >1.3)
Pneumo Ab Type 68 (9V)*: 0.2 ug/mL — ABNORMAL LOW (ref >1.3)
Pneumo Ab Type 70 (33F)*: 2.9 ug/mL (ref >1.3)
Pneumo Ab Type 8*: 1.1 ug/mL — ABNORMAL LOW (ref >1.3)
Pneumo Ab Type 9 (9N)*: <0.1 ug/mL — ABNORMAL LOW (ref >1.3)

## 2018-12-23 LAB — ASPERGILLUS PRECIPITINS
A.Fumigatus #1 Abs: NEGATIVE
Aspergillus Flavus Antibodies: NEGATIVE
Aspergillus Niger Antibodies: NEGATIVE

## 2018-12-23 LAB — IGG, IGA, IGM
IgA/Immunoglobulin A, Serum: 92 mg/dL (ref 61–437)
IgG (Immunoglobin G), Serum: 710 mg/dL (ref 603–1613)
IgM (Immunoglobulin M), Srm: 85 mg/dL (ref 20–172)

## 2018-12-23 LAB — CYSTIC FIBROSIS GENE TEST

## 2018-12-23 LAB — ALPHA-1 ANTITRYPSIN PHENOTYPE: A-1 Antitrypsin: 133 mg/dL (ref 101–187)

## 2018-12-23 LAB — IGE: IgE (Immunoglobulin E), Serum: 15 IU/mL (ref 6–495)

## 2019-01-01 DIAGNOSIS — M1612 Unilateral primary osteoarthritis, left hip: Secondary | ICD-10-CM | POA: Diagnosis not present

## 2019-01-16 DIAGNOSIS — J4550 Severe persistent asthma, uncomplicated: Secondary | ICD-10-CM

## 2019-01-17 ENCOUNTER — Other Ambulatory Visit: Payer: Self-pay

## 2019-01-17 ENCOUNTER — Ambulatory Visit (INDEPENDENT_AMBULATORY_CARE_PROVIDER_SITE_OTHER): Payer: Medicare Other | Admitting: *Deleted

## 2019-01-17 DIAGNOSIS — J4550 Severe persistent asthma, uncomplicated: Secondary | ICD-10-CM | POA: Diagnosis not present

## 2019-01-29 ENCOUNTER — Other Ambulatory Visit: Payer: Self-pay | Admitting: Orthopaedic Surgery

## 2019-02-06 NOTE — Patient Instructions (Addendum)
Kyle [name]  02/06/2019   Your procedure is scheduled on: 02-13-19    Report to [facility] Main  Entrance    Report to Admitting at 7:40 AM   YOU NEED TO HAVE A COVID 19 TEST ON 02-09-19 , THIS TEST MUST BE DONE BEFORE SURGERY, COME TO [place] ENTRANCE BETWEEN THE HOURS OF 900 AM AND 300 PM ON YOUR COVID TEST DATE.   Call this number if you have problems the morning of surgery [phone]    Remember: NO SOLID FOOD AFTER MIDNIGHT THE NIGHT PRIOR TO SURGERY. NOTHING BY MOUTH EXCEPT CLEAR LIQUIDS UNTIL 3 HOURS PRIOR TO SCHEDULED SURGERY. PLEASE FINISH ENSURE DRINK PER SURGEON ORDER 3 HOURS PRIOR TO SCHEDULED SURGERY TIME WHICH NEEDS TO BE COMPLETED AT 4:30 AM.   CLEAR LIQUID DIET   Foods Allowed                                                                     Foods Excluded  Coffee and tea, regular and decaf                             liquids that you cannot  Plain Jell-O in any flavor                                             see through such as: Fruit ices (not with fruit pulp)                                     milk, soups, orange juice  Iced Popsicles                                    All solid food Carbonated beverages, regular and diet                                    Cranberry, grape and apple juices Sports drinks like Gatorade Lightly seasoned clear broth or consume(fat free) Sugar, honey syrup  Sample Menu Breakfast                                Lunch                                     Supper Cranberry juice                    Beef broth                            Chicken broth Jell-O  Grape juice                           Apple juice Coffee or tea                        Jell-O                                      Popsicle                                                Coffee or tea                        Coffee or  tea  _____________________________________________________________________     BRUSH YOUR TEETH MORNING OF SURGERY AND RINSE YOUR MOUTH OUT, NO CHEWING GUM CANDY OR MINTS.     Take these medicines the morning of surgery with A SIP OF WATER :Esomeprazole (Nexium), Loratadine (Claritin),prn  and Hydroxyzine (Vistaril), prn. You may also use and bring your nasal spray and eyedrops                                  You may not have any metal on your body including hair pins and              piercings     Do not wear jewelry, cologne, lotions, powders or perfumes, deodorant              Men may shave face and neck.   Do not bring valuables to the hospital. [place].  Contacts, dentures or bridgework may not be worn into surgery.    Special Instructions: N/A              Please read over the following fact sheets you were given: _____________________________________________________________________             [facility] - Preparing for Surgery Before surgery, you can play an important role.  Because skin is not sterile, your skin needs to be as free of germs as possible.  You can reduce the number of germs on your skin by washing with CHG (chlorahexidine gluconate) soap before surgery.  CHG is an antiseptic cleaner which kills germs and bonds with the skin to continue killing germs even after washing. Please DO NOT use if you have an allergy to CHG or antibacterial soaps.  If your skin becomes reddened/irritated stop using the CHG and inform your nurse when you arrive at Short Stay. Do not shave (including legs and underarms) for at least 48 hours prior to the first CHG shower.  You may shave your face/neck. Please follow these instructions carefully:  1.  Shower with CHG Soap the night before surgery and the  morning of Surgery.  2.  If you choose to wash your hair, wash your hair first as usual with your  normal  shampoo.  3.  After  you shampoo, rinse your hair and body thoroughly to remove the  shampoo.  4.  Use CHG as you would any other liquid soap.  You can apply chg directly  to the skin and wash                       Gently with a scrungie or clean washcloth.  5.  Apply the CHG Soap to your body ONLY FROM THE NECK DOWN.   Do not use on face/ open                           Wound or open sores. Avoid contact with eyes, ears mouth and genitals (private parts).                       Wash face,  Genitals (private parts) with your normal soap.             6.  Wash thoroughly, paying special attention to the area where your surgery  will be performed.  7.  Thoroughly rinse your body with warm water from the neck down.  8.  DO NOT shower/wash with your normal soap after using and rinsing off  the CHG Soap.                9.  Pat yourself dry with a clean towel.            10.  Wear clean pajamas.            11.  Place clean sheets on your bed the night of your first shower and do not  sleep with pets. Day of Surgery : Do not apply any lotions/deodorants the morning of surgery.  Please wear clean clothes to the hospital/surgery center.  FAILURE TO FOLLOW THESE INSTRUCTIONS MAY RESULT IN THE CANCELLATION OF YOUR SURGERY PATIENT SIGNATURE_________________________________  NURSE SIGNATURE__________________________________  ________________________________________________________________________   [name]  An incentive spirometer is a tool that can help keep your lungs clear and active. This tool measures how well you are filling your lungs with each breath. Taking long deep breaths may help reverse or decrease the chance of developing breathing (pulmonary) problems (especially infection) following:  A long period of time when you are unable to move or be active. BEFORE THE PROCEDURE   If the spirometer includes an indicator to show your best effort, your nurse or respiratory therapist  will set it to a desired goal.  If possible, sit up straight or lean slightly forward. Try not to slouch.  Hold the incentive spirometer in an upright position. INSTRUCTIONS FOR USE  1. Sit on the edge of your bed if possible, or sit up as far as you can in bed or on a chair. 2. Hold the incentive spirometer in an upright position. 3. Breathe out normally. 4. Place the mouthpiece in your mouth and seal your lips tightly around it. 5. Breathe in slowly and as deeply as possible, raising the piston or the ball toward the top of the column. 6. Hold your breath for 3-5 seconds or for as long as possible. Allow the piston or ball to fall to the bottom of the column. 7. Remove the mouthpiece from your mouth and breathe out normally. 8. Rest for a few seconds and repeat Steps 1 through 7 at least 10 times every 1-2 hours when you are awake. Take your time and take a few normal breaths between deep breaths. 9. The spirometer may include an indicator to show  your best effort. Use the indicator as a goal to work toward during each repetition. 10. After each set of 10 deep breaths, practice coughing to be sure your lungs are clear. If you have an incision (the cut made at the time of surgery), support your incision when coughing by placing a pillow or rolled up towels firmly against it. Once you are able to get out of bed, walk around indoors and cough well. You may stop using the incentive spirometer when instructed by your caregiver.  RISKS AND COMPLICATIONS  Take your time so you do not get dizzy or light-headed.  If you are in pain, you may need to take or ask for pain medication before doing incentive spirometry. It is harder to take a deep breath if you are having pain. AFTER USE  Rest and breathe slowly and easily.  It can be helpful to keep track of a log of your progress. Your caregiver can provide you with a simple table to help with this. If you are using the spirometer at home, follow  these instructions: [place] IF:   You are having difficultly using the spirometer.  You have trouble using the spirometer as often as instructed.  Your pain medication is not giving enough relief while using the spirometer.  You develop fever of 100.5 F (38.1 C) or higher. SEEK IMMEDIATE MEDICAL CARE IF:   You cough up bloody sputum that had not been present before.  You develop fever of 102 F (38.9 C) or greater.  You develop worsening pain at or near the incision site. MAKE SURE YOU:   Understand these instructions.  Will watch your condition.  Will get help right away if you are not doing well or get worse. Document Released: 01/10/2007 Document Revised: 11/22/2011 Document Reviewed: 03/13/2007 ExitCare Patient Information 2014 Exit[employer], [place].   ________________________________________________________________________  WHAT IS A BLOOD TRANSFUSION? Blood Transfusion Information  A transfusion is the replacement of blood or some of its parts. Blood is made up of multiple cells which provide different functions.  Red blood cells carry oxygen and are used for blood loss replacement.  White blood cells fight against infection.  Platelets control bleeding.  Plasma helps clot blood.  Other blood products are available for specialized needs, such as hemophilia or other clotting disorders. BEFORE THE TRANSFUSION  Who gives blood for transfusions?   Healthy volunteers who are fully evaluated to make sure their blood is safe. This is blood bank blood. Transfusion therapy is the safest it has ever been in the practice of medicine. Before blood is taken from a donor, a complete history is taken to make sure that person has no history of diseases nor engages in risky social behavior (examples are intravenous drug use or sexual activity with multiple partners). The donor's travel history is screened to minimize risk of transmitting infections, such as malaria. The  donated blood is tested for signs of infectious diseases, such as HIV and hepatitis. The blood is then tested to be sure it is compatible with you in order to minimize the chance of a transfusion reaction. If you or a relative donates blood, this is often done in anticipation of surgery and is not appropriate for emergency situations. It takes many days to process the donated blood. RISKS AND COMPLICATIONS Although transfusion therapy is very safe and saves many lives, the main dangers of transfusion include:   Getting an infectious disease.  Developing a transfusion reaction. This is an allergic reaction to  something in the blood you were given. Every precaution is taken to prevent this. The decision to have a blood transfusion has been considered carefully by your caregiver before blood is given. Blood is not given unless the benefits outweigh the risks. AFTER THE TRANSFUSION  Right after receiving a blood transfusion, you will usually feel much better and more energetic. This is especially true if your red blood cells have gotten low (anemic). The transfusion raises the level of the red blood cells which carry oxygen, and this usually causes an energy increase.  The nurse administering the transfusion will monitor you carefully for complications. HOME CARE INSTRUCTIONS  No special instructions are needed after a transfusion. You may find your energy is better. Speak with your caregiver about any limitations on activity for underlying diseases you may have. SEEK MEDICAL CARE IF:   Your condition is not improving after your transfusion.  You develop redness or irritation at the intravenous (IV) site. SEEK IMMEDIATE MEDICAL CARE IF:  Any of the following symptoms occur over the next 12 hours:  Shaking chills.  You have a temperature by mouth above 102 F (38.9 C), not controlled by medicine.  Chest, back, or muscle pain.  People around you feel you are not acting correctly or are  confused.  Shortness of breath or difficulty breathing.  Dizziness and fainting.  You get a rash or develop hives.  You have a decrease in urine output.  Your urine turns a dark color or changes to pink, red, or brown. Any of the following symptoms occur over the next 10 days:  You have a temperature by mouth above 102 F (38.9 C), not controlled by medicine.  Shortness of breath.  Weakness after normal activity.  The white part of the eye turns yellow (jaundice).  You have a decrease in the amount of urine or are urinating less often.  Your urine turns a dark color or changes to pink, red, or brown. Document Released: 08/27/2000 Document Revised: 11/22/2011 Document Reviewed: 04/15/2008 [facility] Patient Information 2014 [place], [place].  _______________________________________________________________________

## 2019-02-06 NOTE — Progress Notes (Signed)
SPOKE W/Pt _     SCREENING SYMPTOMS OF COVID 19:   COUGH--No  RUNNY NOSE--- No  SORE THROAT---No  NASAL CONGESTION----No  SNEEZING----No  SHORTNESS OF BREATH---No  DIFFICULTY BREATHING---No  TEMP >100.0 -----No  UNEXPLAINED BODY ACHES------No  CHILLS --------No   HEADACHES ---------No  LOSS OF SMELL/ TASTE --------No    HAVE YOU OR ANY FAMILY MEMBER TRAVELLED PAST 14 DAYS OUT OF THE   COUNTY---No STATE----No COUNTRY---No-  HAVE YOU OR ANY FAMILY MEMBER BEEN EXPOSED TO ANYONE WITH COVID 19? No    

## 2019-02-07 ENCOUNTER — Ambulatory Visit (HOSPITAL_COMMUNITY)
Admission: RE | Admit: 2019-02-07 | Discharge: 2019-02-07 | Disposition: A | Discharge status: Home / Self Care | Payer: Medicare Other | Source: Ambulatory Visit | Attending: Orthopaedic Surgery | Admitting: Orthopaedic Surgery

## 2019-02-07 ENCOUNTER — Other Ambulatory Visit: Payer: Self-pay

## 2019-02-07 ENCOUNTER — Encounter (HOSPITAL_COMMUNITY)
Admission: RE | Admit: 2019-02-07 | Discharge: 2019-02-07 | Disposition: A | Discharge status: Home / Self Care | Payer: Medicare Other | Source: Ambulatory Visit | Attending: Orthopaedic Surgery | Admitting: Orthopaedic Surgery

## 2019-02-07 ENCOUNTER — Other Ambulatory Visit: Payer: Self-pay | Admitting: Orthopaedic Surgery

## 2019-02-07 ENCOUNTER — Encounter (HOSPITAL_COMMUNITY): Payer: Self-pay

## 2019-02-07 DIAGNOSIS — Z01818 Encounter for other preprocedural examination: Secondary | ICD-10-CM

## 2019-02-07 DIAGNOSIS — J984 Other disorders of lung: Secondary | ICD-10-CM | POA: Diagnosis not present

## 2019-02-07 DIAGNOSIS — F430 Acute stress reaction: Secondary | ICD-10-CM | POA: Diagnosis not present

## 2019-02-07 LAB — BASIC METABOLIC PANEL
Anion gap: 10 (ref 5–15)
BUN: 19 mg/dL (ref 8–23)
CO2: 21 mmol/L — ABNORMAL LOW (ref 22–32)
Calcium: 8.8 mg/dL — ABNORMAL LOW (ref 8.9–10.3)
Chloride: 108 mmol/L (ref 98–111)
Creatinine, Ser: 1.05 mg/dL (ref 0.61–1.24)
GFR calc Af Amer: >60 mL/min (ref >60)
GFR calc non Af Amer: >60 mL/min (ref >60)
Glucose, Bld: 124 mg/dL — ABNORMAL HIGH (ref 70–99)
Potassium: 3.6 mmol/L (ref 3.5–5.1)
Sodium: 139 mmol/L (ref 135–145)

## 2019-02-07 LAB — URINALYSIS, ROUTINE W REFLEX MICROSCOPIC
Bilirubin Urine: NEGATIVE
Glucose, UA: NEGATIVE mg/dL
Hgb urine dipstick: NEGATIVE
Ketones, ur: NEGATIVE mg/dL
Leukocytes,Ua: NEGATIVE
Nitrite: NEGATIVE
Protein, ur: NEGATIVE mg/dL
Specific Gravity, Urine: 1.024 (ref 1.005–1.030)
pH: 6 (ref 5.0–8.0)

## 2019-02-07 LAB — PROTIME-INR
INR: 1.1 (ref 0.8–1.2)
Prothrombin Time: 13.8 seconds (ref 11.4–15.2)

## 2019-02-07 LAB — CBC WITH DIFFERENTIAL/PLATELET
Abs Immature Granulocytes: 0.01 10*3/uL (ref 0.00–0.07)
Basophils Absolute: 0 10*3/uL (ref 0.0–0.1)
Basophils Relative: 1 %
Eosinophils Absolute: 0 10*3/uL (ref 0.0–0.5)
Eosinophils Relative: 1 %
HCT: 44.4 % (ref 39.0–52.0)
Hemoglobin: 14.6 g/dL (ref 13.0–17.0)
Immature Granulocytes: 0 %
Lymphocytes Relative: 34 %
Lymphs Abs: 1.5 10*3/uL (ref 0.7–4.0)
MCH: 30.5 pg (ref 26.0–34.0)
MCHC: 32.9 g/dL (ref 30.0–36.0)
MCV: 92.7 fL (ref 80.0–100.0)
Monocytes Absolute: 0.5 10*3/uL (ref 0.1–1.0)
Monocytes Relative: 11 %
Neutro Abs: 2.3 10*3/uL (ref 1.7–7.7)
Neutrophils Relative %: 53 %
Platelets: 188 10*3/uL (ref 150–400)
RBC: 4.79 MIL/uL (ref 4.22–5.81)
RDW: 12.4 % (ref 11.5–15.5)
WBC: 4.3 10*3/uL (ref 4.0–10.5)
nRBC: 0 % (ref 0.0–0.2)

## 2019-02-07 LAB — SURGICAL PCR SCREEN
MRSA, PCR: NEGATIVE
Staphylococcus aureus: NEGATIVE

## 2019-02-07 LAB — ABO/RH: ABO/RH(D): O POS

## 2019-02-07 LAB — APTT: aPTT: 31 seconds (ref 24–36)

## 2019-02-07 NOTE — Care Plan (Signed)
Spoke with patient prior to surgery. He plans to discharge to home with family and go to [place]. Rolling walker has been ordered for him from [place] to be delivered to hospital prior to discharge. Patient and MD in agreement with plan. CHoice offered.    Ladell Heads, [place]

## 2019-02-09 ENCOUNTER — Other Ambulatory Visit (HOSPITAL_COMMUNITY)
Admission: RE | Admit: 2019-02-09 | Discharge: 2019-02-09 | Disposition: A | Discharge status: Home / Self Care | Payer: Medicare Other | Source: Ambulatory Visit | Attending: Orthopaedic Surgery | Admitting: Orthopaedic Surgery

## 2019-02-09 DIAGNOSIS — Z1159 Encounter for screening for other viral diseases: Secondary | ICD-10-CM | POA: Insufficient documentation

## 2019-02-10 LAB — NOVEL CORONAVIRUS, NAA (HOSP ORDER, SEND-OUT TO REF LAB; TAT 18-24 HRS): SARS-CoV-2, NAA: NOT DETECTED

## 2019-02-12 ENCOUNTER — Other Ambulatory Visit: Payer: Self-pay

## 2019-02-12 MED ORDER — BUPIVACAINE LIPOSOME 1.3 % IJ SUSP
10.0000 mL | Freq: Once | INTRAMUSCULAR | Status: DC
  Filled 2019-02-12: qty 10

## 2019-02-12 MED ORDER — TRANEXAMIC ACID 1000 MG/10ML IV SOLN
2000.0000 mg | INTRAVENOUS | Status: DC
  Filled 2019-02-12: qty 20

## 2019-02-12 NOTE — H&P (Signed)
TOTAL HIP ADMISSION H&P  Patient is admitted for left total hip arthroplasty.  Subjective:  Chief Complaint: left hip pain  HPI: Kyle [name], 68 y.o. male, has a history of pain and functional disability in the left hip(s) due to arthritis and patient has failed non-surgical conservative treatments for greater than 12 weeks to include NSAID's and/or analgesics, corticosteriod injections, flexibility and strengthening excercises, use of assistive devices, weight reduction as appropriate and activity modification.  Onset of symptoms was gradual starting 5 years ago with gradually worsening course since that time.The patient noted no past surgery on the left hip(s).  Patient currently rates pain in the left hip at 10 out of 10 with activity. Patient has night pain, worsening of pain with activity and weight bearing, trendelenberg gait, pain that interfers with activities of daily living and crepitus. Patient has evidence of subchondral cysts, subchondral sclerosis, periarticular osteophytes and joint space narrowing by imaging studies. This condition presents safety issues increasing the risk of falls.   There is no current active infection.  Patient Active Problem List   Diagnosis Date Noted  . LPRD (laryngopharyngeal reflux disease) 08/30/2017  . Asthma, not well controlled, moderate persistent, with acute exacerbation 08/30/2017  . Allergic rhinoconjunctivitis 08/30/2017  . Paresthesia 07/11/2017  . Headache 07/11/2017   Past Medical History:  Diagnosis Date  . Asthma   . Cluster headache   . Complication of anesthesia 1990   Difficult to arouse and bronchospasm  . Prostate cancer ([place]) 2020  . Reflux   . Sinusitis     Past Surgical History:  Procedure Laterality Date  . bone spur removed    . KNEE ARTHROPLASTY     x 2  . SINOSCOPY     6 SURGERIES  . tear duct replacement      Current Facility-Administered Medications  Medication Dose Route Frequency Provider Last Rate  Last Dose  . Benralizumab SOSY 30 mg  30 mg Subcutaneous Q28 days Jiles Prows, MD   30 mg at 01/17/19 1412  . [START ON 02/13/2019] bupivacaine liposome (EXPAREL) 1.3 % injection 133 mg  10 mL Infiltration Once Melrose Nakayama, MD      . Derrill Memo ON 02/13/2019] tranexamic acid (CYKLOKAPRON) 2,000 mg in sodium chloride 0.9 % 50 mL Topical Application  0,938 mg Topical To OR Melrose Nakayama, MD       Current Outpatient Medications  Medication Sig Dispense Refill Last Dose  . albuterol (PROAIR HFA) 108 (90 Base) MCG/ACT inhaler Inhale two puffs every 4-6 hours if needed for cough or wheeze. (Patient taking differently: Inhale 2 puffs into the lungs every 6 (six) hours as needed for wheezing or shortness of breath. ) 3 Inhaler 0 Taking  . albuterol (PROVENTIL) (2.5 MG/3ML) 0.083% nebulizer solution Take 2.5 mg by nebulization every 4 (four) hours as needed for wheezing or shortness of breath.   0 Not Taking  . beclomethasone (QVAR REDIHALER) 80 MCG/ACT inhaler Inhale two doses twice daily during asthma flare-up.  Rinse, gargle, and spit after use. (Patient taking differently: Inhale 2 puffs into the lungs 2 (two) times daily as needed (shortness of breath). ) 3 Inhaler 1 Taking  . budesonide-formoterol (SYMBICORT) 160-4.5 MCG/ACT inhaler USE 2 INHALATIONS ORALLY TWICE DAILY TO PREVENT COUGH OR WHEEZE. RINSE, GARGLE AND SPIT AFTER USE (Patient taking differently: Inhale 2 puffs into the lungs 2 (two) times a day. ) 30.6 g 1 Taking  . Carboxymethylcellul-Glycerin (LUBRICATING EYE DROPS OP) Place 1 drop into both eyes daily as  needed (irritation).     Marland Kitchen esomeprazole (NEXIUM) 40 MG capsule Take 1 capsule (40 mg total) by mouth daily. Can increase to twice a day during flare-up. (Patient taking differently: Take 40 mg by mouth daily as needed (acid reflux). ) 180 capsule 1 Taking  . hydrOXYzine (VISTARIL) 25 MG capsule Take 25 mg by mouth daily as needed (frequent urination).      Marland Kitchen ibuprofen (ADVIL) 200 MG tablet  Take 600 mg by mouth daily as needed for moderate pain.     Marland Kitchen loratadine (CLARITIN) 10 MG tablet Take 10 mg by mouth daily as needed for allergies.     . Melatonin 5 MG CAPS Take 5 mg by mouth at bedtime as needed (sleep).     . mometasone (NASONEX) 50 MCG/ACT nasal spray Use 1-2 sprays in each nostril once daily. (Patient taking differently: Place 1 spray into the nose 2 (two) times a day. ) 51 g 3 Taking  . Ascorbic Acid (VITAMIN C) 1000 MG tablet Take 1,000 mg by mouth daily.     Marland Kitchen EPINEPHrine 0.3 mg/0.3 mL IJ SOAJ injection Use for life threatening allergic reactions (Patient not taking: Reported on 01/31/2019) 4 Device 3 Not Taking at Unknown time  . GARLIC PO Take 1 tablet by mouth daily.    Taking  . Ginger, Zingiber officinalis, (GINGER PO) Take 1 tablet by mouth daily.    Taking  . Misc Natural Products (TURMERIC CURCUMIN) CAPS Take 1 capsule by mouth daily.     . Multiple Vitamins-Minerals (MULTIVITAMIN PO) Take 1 tablet by mouth daily.    Taking   Allergies  Allergen Reactions  . Augmentin [Amoxicillin-Pot Clavulanate]     Terrible heartburn Did it involve swelling of the face/tongue/throat, SOB, or low BP? No Did it involve sudden or severe rash/hives, skin peeling, or any reaction on the inside of your mouth or nose? No Did you need to seek medical attention at a hospital or doctor's office? No When did it last happen?2 years If all above answers are "NO", may proceed with cephalosporin use.   Mack [place] [Levofloxacin In D5w]     Terrible heartburn    Social History   Tobacco Use  . Smoking status: Never Smoker  . Smokeless tobacco: Never Used  Substance Use Topics  . Alcohol use: No    Family History  Problem Relation Age of Onset  . Emphysema Mother   . Heart attack Mother   . Asthma Father   . Stroke Father      Review of Systems  Musculoskeletal: Positive for joint pain.       Left hip  All other systems reviewed and are  negative.   Objective:  Physical Exam  Constitutional: He is oriented to person, place, and time. He appears well-developed and well-nourished.  HENT:  Head: Normocephalic and atraumatic.  Eyes: Pupils are equal, round, and reactive to light.  Neck: Normal range of motion.  Cardiovascular: Normal rate and regular rhythm.  Respiratory: Effort normal.  GI: Soft.  Musculoskeletal:     Comments: Left hip motion is limited and painful in internal rotation.  Leg lengths are equal.  He walks with an altered gait.  Sensation and motor function are intact in his feet with palpable pulses on both sides.    Neurological: He is alert and oriented to person, place, and time.  Skin: Skin is warm and dry.  Psychiatric: He has a normal mood and affect. His behavior is normal. Judgment and  thought content normal.    Vital signs in last 24 hours:    Labs:   Estimated body mass index is 26.66 kg/m as calculated from the following:   Height as of 02/07/19: 6\' 1"  (1.854 m).   Weight as of 02/07/19: 91.7 kg.   Imaging Review Plain radiographs demonstrate severe degenerative joint disease of the left hip(s). The bone quality appears to be good for age and reported activity level.      Assessment/Plan:  End stage primary arthritis, left hip(s)  The patient history, physical examination, clinical judgement of the provider and imaging studies are consistent with end stage degenerative joint disease of the left hip(s) and total hip arthroplasty is deemed medically necessary. The treatment options including medical management, injection therapy, arthroscopy and arthroplasty were discussed at length. The risks and benefits of total hip arthroplasty were presented and reviewed. The risks due to aseptic loosening, infection, stiffness, dislocation/subluxation,  thromboembolic complications and other imponderables were discussed.  The patient acknowledged the explanation, agreed to proceed with the plan  and consent was signed. Patient is being admitted for inpatient treatment for surgery, pain control, PT, OT, prophylactic antibiotics, VTE prophylaxis, progressive ambulation and ADL's and discharge planning.The patient is planning to be discharged home with home health services

## 2019-02-12 NOTE — Progress Notes (Signed)
SPOKE [place] OF COVID 19:   COUGH--no  RUNNY NOSE--- no  SORE THROAT---no  NASAL CONGESTION----no  SNEEZING----no  SHORTNESS OF BREATH---no  DIFFICULTY BREATHING---no  TEMP >100.0 -----no  UNEXPLAINED BODY ACHES------no  CHILLS -------- no  HEADACHES ---------no  LOSS OF SMELL/ TASTE --------no    HAVE YOU OR ANY FAMILY MEMBER TRAVELLED PAST 14 DAYS OUT OF THE   COUNTY---no STATE----no COUNTRY----no  HAVE YOU OR ANY FAMILY MEMBER BEEN EXPOSED TO ANYONE WITH COVID 19? no

## 2019-02-13 ENCOUNTER — Inpatient Hospital Stay (HOSPITAL_COMMUNITY): Payer: Medicare Other | Admitting: Anesthesiology

## 2019-02-13 ENCOUNTER — Encounter (HOSPITAL_COMMUNITY): Payer: Self-pay | Admitting: *Deleted

## 2019-02-13 ENCOUNTER — Encounter (HOSPITAL_COMMUNITY)
Admission: RE | Disposition: A | Discharge status: Home / Self Care | Payer: Self-pay | Source: Other Acute Inpatient Hospital | Attending: Orthopaedic Surgery

## 2019-02-13 ENCOUNTER — Inpatient Hospital Stay (HOSPITAL_COMMUNITY): Payer: Medicare Other

## 2019-02-13 ENCOUNTER — Observation Stay (HOSPITAL_COMMUNITY)
Admission: RE | Admit: 2019-02-13 | Discharge: 2019-02-14 | Disposition: A | Discharge status: Home / Self Care | Payer: Medicare Other | Source: Other Acute Inpatient Hospital | Attending: Orthopaedic Surgery | Admitting: Orthopaedic Surgery

## 2019-02-13 ENCOUNTER — Inpatient Hospital Stay (HOSPITAL_COMMUNITY): Payer: Medicare Other | Admitting: Physician Assistant

## 2019-02-13 DIAGNOSIS — M1612 Unilateral primary osteoarthritis, left hip: Secondary | ICD-10-CM | POA: Diagnosis not present

## 2019-02-13 DIAGNOSIS — Z7951 Long term (current) use of inhaled steroids: Secondary | ICD-10-CM | POA: Insufficient documentation

## 2019-02-13 DIAGNOSIS — Z79899 Other long term (current) drug therapy: Secondary | ICD-10-CM | POA: Diagnosis not present

## 2019-02-13 DIAGNOSIS — K219 Gastro-esophageal reflux disease without esophagitis: Secondary | ICD-10-CM | POA: Insufficient documentation

## 2019-02-13 DIAGNOSIS — Z881 Allergy status to other antibiotic agents status: Secondary | ICD-10-CM | POA: Insufficient documentation

## 2019-02-13 DIAGNOSIS — Z96642 Presence of left artificial hip joint: Secondary | ICD-10-CM | POA: Diagnosis not present

## 2019-02-13 DIAGNOSIS — Z880 Allergy status to penicillin: Secondary | ICD-10-CM | POA: Insufficient documentation

## 2019-02-13 DIAGNOSIS — Z8546 Personal history of malignant neoplasm of prostate: Secondary | ICD-10-CM | POA: Insufficient documentation

## 2019-02-13 DIAGNOSIS — Z96659 Presence of unspecified artificial knee joint: Secondary | ICD-10-CM | POA: Insufficient documentation

## 2019-02-13 DIAGNOSIS — Z419 Encounter for procedure for purposes other than remedying health state, unspecified: Secondary | ICD-10-CM

## 2019-02-13 HISTORY — DX: Unilateral primary osteoarthritis, left hip: M16.12

## 2019-02-13 HISTORY — PX: TOTAL HIP ARTHROPLASTY: SHX124

## 2019-02-13 LAB — TYPE AND SCREEN
ABO/RH(D): O POS
Antibody Screen: NEGATIVE

## 2019-02-13 SURGERY — ARTHROPLASTY, HIP, TOTAL, ANTERIOR APPROACH
Anesthesia: Spinal | Laterality: Left

## 2019-02-13 MED ORDER — ASPIRIN EC 325 MG PO TBEC: 325.0000 mg | DELAYED_RELEASE_TABLET | Freq: Two times a day (BID) | ORAL | Status: DC

## 2019-02-13 MED ORDER — PROPOFOL 10 MG/ML IV BOLUS
INTRAVENOUS | Status: AC
  Filled 2019-02-13: qty 40

## 2019-02-13 MED ORDER — HYDROXYZINE HCL 25 MG PO TABS: 25.0000 mg | ORAL_TABLET | Freq: Every day | ORAL | Status: DC | PRN

## 2019-02-13 MED ORDER — EPHEDRINE SULFATE-NACL 50-0.9 MG/10ML-% IV SOSY
PREFILLED_SYRINGE | INTRAVENOUS | Status: DC | PRN
  Administered 2019-02-13: 5 mg via INTRAVENOUS

## 2019-02-13 MED ORDER — PROPOFOL 10 MG/ML IV BOLUS
INTRAVENOUS | Status: AC
  Filled 2019-02-13: qty 20

## 2019-02-13 MED ORDER — MOMETASONE FURO-FORMOTEROL FUM 200-5 MCG/ACT IN AERO
2.0000 | INHALATION_SPRAY | Freq: Two times a day (BID) | RESPIRATORY_TRACT | Status: DC
  Administered 2019-02-13 – 2019-02-14 (×2): 2 via RESPIRATORY_TRACT
  Filled 2019-02-13: qty 8.8

## 2019-02-13 MED ORDER — ONDANSETRON HCL 4 MG/2ML IJ SOLN: 4.0000 mg | Freq: Four times a day (QID) | INTRAMUSCULAR | Status: DC | PRN

## 2019-02-13 MED ORDER — HYDROCODONE-ACETAMINOPHEN 5-325 MG PO TABS
1.0000 | ORAL_TABLET | ORAL | Status: DC | PRN
  Administered 2019-02-13: 2 via ORAL
  Administered 2019-02-13: 1 via ORAL
  Administered 2019-02-14 (×2): 2 via ORAL
  Filled 2019-02-13 (×2): qty 2
  Filled 2019-02-13: qty 1
  Filled 2019-02-13: qty 2

## 2019-02-13 MED ORDER — DEXAMETHASONE SODIUM PHOSPHATE 10 MG/ML IJ SOLN
INTRAMUSCULAR | Status: DC | PRN
  Administered 2019-02-13: 10 mg via INTRAVENOUS

## 2019-02-13 MED ORDER — HYDROXYZINE PAMOATE 25 MG PO CAPS: 25.0000 mg | ORAL_CAPSULE | Freq: Every day | ORAL | Status: DC | PRN

## 2019-02-13 MED ORDER — BUPIVACAINE IN DEXTROSE 0.75-8.25 % IT SOLN
INTRATHECAL | Status: DC | PRN
  Administered 2019-02-13: 1.6 mL via INTRATHECAL

## 2019-02-13 MED ORDER — PROPOFOL 500 MG/50ML IV EMUL
INTRAVENOUS | Status: DC | PRN
  Administered 2019-02-13: 100 ug/kg/min via INTRAVENOUS

## 2019-02-13 MED ORDER — DOCUSATE SODIUM 100 MG PO CAPS
100.0000 mg | ORAL_CAPSULE | Freq: Two times a day (BID) | ORAL | Status: DC
  Administered 2019-02-13 – 2019-02-14 (×2): 100 mg via ORAL
  Filled 2019-02-13 (×2): qty 1

## 2019-02-13 MED ORDER — ALBUTEROL SULFATE (2.5 MG/3ML) 0.083% IN NEBU: 2.5000 mg | INHALATION_SOLUTION | RESPIRATORY_TRACT | Status: DC | PRN

## 2019-02-13 MED ORDER — STERILE WATER FOR IRRIGATION IR SOLN
Status: DC | PRN
  Administered 2019-02-13: 2000 mL

## 2019-02-13 MED ORDER — CHLORHEXIDINE GLUCONATE 4 % EX LIQD: 60.0000 mL | Freq: Once | CUTANEOUS | Status: DC

## 2019-02-13 MED ORDER — LORATADINE 10 MG PO TABS: 10.0000 mg | ORAL_TABLET | Freq: Every day | ORAL | Status: DC | PRN

## 2019-02-13 MED ORDER — FLUTICASONE PROPIONATE 50 MCG/ACT NA SUSP
1.0000 | Freq: Every day | NASAL | Status: DC
  Administered 2019-02-14: 09:00:00 1 via NASAL
  Filled 2019-02-13: qty 16

## 2019-02-13 MED ORDER — ALBUTEROL SULFATE HFA 108 (90 BASE) MCG/ACT IN AERS: 2.0000 | INHALATION_SPRAY | Freq: Four times a day (QID) | RESPIRATORY_TRACT | Status: DC | PRN

## 2019-02-13 MED ORDER — METHOCARBAMOL 500 MG IVPB - SIMPLE MED
INTRAVENOUS | Status: AC
  Filled 2019-02-13: qty 50

## 2019-02-13 MED ORDER — ONDANSETRON HCL 4 MG PO TABS: 4.0000 mg | ORAL_TABLET | Freq: Four times a day (QID) | ORAL | Status: DC | PRN

## 2019-02-13 MED ORDER — BUDESONIDE 0.25 MG/2ML IN SUSP
0.2500 mg | Freq: Two times a day (BID) | RESPIRATORY_TRACT | Status: DC
  Administered 2019-02-13: 0.25 mg via RESPIRATORY_TRACT
  Filled 2019-02-13: qty 2

## 2019-02-13 MED ORDER — VANCOMYCIN HCL IN DEXTROSE 1-5 GM/200ML-% IV SOLN
1000.0000 mg | Freq: Two times a day (BID) | INTRAVENOUS | Status: DC
  Filled 2019-02-13: qty 200

## 2019-02-13 MED ORDER — APIXABAN 2.5 MG PO TABS
2.5000 mg | ORAL_TABLET | Freq: Two times a day (BID) | ORAL | Status: DC
  Administered 2019-02-14: 2.5 mg via ORAL
  Filled 2019-02-13: qty 1

## 2019-02-13 MED ORDER — LACTATED RINGERS IV SOLN
INTRAVENOUS | Status: DC
  Administered 2019-02-13: 09:00:00 via INTRAVENOUS

## 2019-02-13 MED ORDER — ONDANSETRON HCL 4 MG/2ML IJ SOLN: 4.0000 mg | Freq: Once | INTRAMUSCULAR | Status: DC | PRN

## 2019-02-13 MED ORDER — EPHEDRINE 5 MG/ML INJ
INTRAVENOUS | Status: AC
  Filled 2019-02-13: qty 10

## 2019-02-13 MED ORDER — ALUM & MAG HYDROXIDE-SIMETH 200-200-20 MG/5ML PO SUSP: 30.0000 mL | ORAL | Status: DC | PRN

## 2019-02-13 MED ORDER — BUPIVACAINE LIPOSOME 1.3 % IJ SUSP
INTRAMUSCULAR | Status: DC | PRN
  Administered 2019-02-13: 10 mL

## 2019-02-13 MED ORDER — MIDAZOLAM HCL 2 MG/2ML IJ SOLN
INTRAMUSCULAR | Status: AC
  Filled 2019-02-13: qty 2

## 2019-02-13 MED ORDER — MIDAZOLAM HCL 5 MG/5ML IJ SOLN
INTRAMUSCULAR | Status: DC | PRN
  Administered 2019-02-13: 2 mg via INTRAVENOUS

## 2019-02-13 MED ORDER — FENTANYL CITRATE (PF) 100 MCG/2ML IJ SOLN
25.0000 ug | INTRAMUSCULAR | Status: DC | PRN
  Administered 2019-02-13: 50 ug via INTRAVENOUS

## 2019-02-13 MED ORDER — LACTATED RINGERS IV SOLN
INTRAVENOUS | Status: DC
  Administered 2019-02-13: 21:00:00 via INTRAVENOUS

## 2019-02-13 MED ORDER — FENTANYL CITRATE (PF) 100 MCG/2ML IJ SOLN
INTRAMUSCULAR | Status: AC
  Filled 2019-02-13: qty 2

## 2019-02-13 MED ORDER — DEXAMETHASONE SODIUM PHOSPHATE 10 MG/ML IJ SOLN
INTRAMUSCULAR | Status: AC
  Filled 2019-02-13: qty 1

## 2019-02-13 MED ORDER — MENTHOL 3 MG MT LOZG: 1.0000 | LOZENGE | OROMUCOSAL | Status: DC | PRN

## 2019-02-13 MED ORDER — PROPOFOL 10 MG/ML IV BOLUS
INTRAVENOUS | Status: AC
  Filled 2019-02-13: qty 60

## 2019-02-13 MED ORDER — HYDROCODONE-ACETAMINOPHEN 7.5-325 MG PO TABS: 1.0000 | ORAL_TABLET | ORAL | Status: DC | PRN

## 2019-02-13 MED ORDER — KETOROLAC TROMETHAMINE 15 MG/ML IJ SOLN
7.5000 mg | Freq: Four times a day (QID) | INTRAMUSCULAR | Status: AC
  Administered 2019-02-13 – 2019-02-14 (×3): 7.5 mg via INTRAVENOUS
  Filled 2019-02-13 (×3): qty 1

## 2019-02-13 MED ORDER — 0.9 % SODIUM CHLORIDE (POUR BTL) OPTIME
TOPICAL | Status: DC | PRN
  Administered 2019-02-13: 1000 mL

## 2019-02-13 MED ORDER — BUPIVACAINE-EPINEPHRINE (PF) 0.25% -1:200000 IJ SOLN
INTRAMUSCULAR | Status: AC
  Filled 2019-02-13: qty 30

## 2019-02-13 MED ORDER — MELATONIN 5 MG PO TABS
5.0000 mg | ORAL_TABLET | Freq: Every evening | ORAL | Status: DC | PRN
  Filled 2019-02-13: qty 1

## 2019-02-13 MED ORDER — ONDANSETRON HCL 4 MG/2ML IJ SOLN
INTRAMUSCULAR | Status: DC | PRN
  Administered 2019-02-13: 4 mg via INTRAVENOUS

## 2019-02-13 MED ORDER — DIPHENHYDRAMINE HCL 12.5 MG/5ML PO ELIX: 12.5000 mg | ORAL_SOLUTION | ORAL | Status: DC | PRN

## 2019-02-13 MED ORDER — POVIDONE-IODINE 10 % EX SWAB: 2.0000 "application " | Freq: Once | CUTANEOUS | Status: DC

## 2019-02-13 MED ORDER — MORPHINE SULFATE (PF) 2 MG/ML IV SOLN
0.5000 mg | INTRAVENOUS | Status: DC | PRN
  Administered 2019-02-13: 1 mg via INTRAVENOUS
  Filled 2019-02-13: qty 1

## 2019-02-13 MED ORDER — ONDANSETRON HCL 4 MG/2ML IJ SOLN
INTRAMUSCULAR | Status: AC
  Filled 2019-02-13: qty 2

## 2019-02-13 MED ORDER — ACETAMINOPHEN 500 MG PO TABS
500.0000 mg | ORAL_TABLET | Freq: Four times a day (QID) | ORAL | Status: AC
  Administered 2019-02-13 – 2019-02-14 (×2): 500 mg via ORAL
  Filled 2019-02-13 (×2): qty 1

## 2019-02-13 MED ORDER — TRANEXAMIC ACID-NACL 1000-0.7 MG/100ML-% IV SOLN
1000.0000 mg | Freq: Once | INTRAVENOUS | Status: AC
  Administered 2019-02-13: 1000 mg via INTRAVENOUS
  Filled 2019-02-13: qty 100

## 2019-02-13 MED ORDER — METHOCARBAMOL 500 MG IVPB - SIMPLE MED
500.0000 mg | Freq: Four times a day (QID) | INTRAVENOUS | Status: DC | PRN
  Administered 2019-02-13: 13:00:00 500 mg via INTRAVENOUS
  Filled 2019-02-13: qty 50

## 2019-02-13 MED ORDER — LACTATED RINGERS IV SOLN
INTRAVENOUS | Status: DC
  Administered 2019-02-13 (×2): via INTRAVENOUS

## 2019-02-13 MED ORDER — METOCLOPRAMIDE HCL 5 MG/ML IJ SOLN: 5.0000 mg | Freq: Three times a day (TID) | INTRAMUSCULAR | Status: DC | PRN

## 2019-02-13 MED ORDER — PROPOFOL 10 MG/ML IV BOLUS
INTRAVENOUS | Status: DC | PRN
  Administered 2019-02-13: 30 mg via INTRAVENOUS

## 2019-02-13 MED ORDER — ACETAMINOPHEN 325 MG PO TABS: 325.0000 mg | ORAL_TABLET | Freq: Four times a day (QID) | ORAL | Status: DC | PRN

## 2019-02-13 MED ORDER — VANCOMYCIN HCL 1000 MG IV SOLR
1000.0000 mg | Freq: Two times a day (BID) | INTRAVENOUS | Status: AC
  Administered 2019-02-13: 1000 mg via INTRAVENOUS
  Filled 2019-02-13: qty 1000

## 2019-02-13 MED ORDER — BISACODYL 5 MG PO TBEC: 5.0000 mg | DELAYED_RELEASE_TABLET | Freq: Every day | ORAL | Status: DC | PRN

## 2019-02-13 MED ORDER — TRANEXAMIC ACID 1000 MG/10ML IV SOLN
INTRAVENOUS | Status: DC | PRN
  Administered 2019-02-13: 2000 mg via TOPICAL

## 2019-02-13 MED ORDER — BUPIVACAINE-EPINEPHRINE (PF) 0.25% -1:200000 IJ SOLN
INTRAMUSCULAR | Status: DC | PRN
  Administered 2019-02-13: 30 mL

## 2019-02-13 MED ORDER — METOCLOPRAMIDE HCL 5 MG PO TABS: 5.0000 mg | ORAL_TABLET | Freq: Three times a day (TID) | ORAL | Status: DC | PRN

## 2019-02-13 MED ORDER — OXYCODONE HCL 5 MG/5ML PO SOLN: 5.0000 mg | Freq: Once | ORAL | Status: DC | PRN

## 2019-02-13 MED ORDER — OXYCODONE HCL 5 MG PO TABS: 5.0000 mg | ORAL_TABLET | Freq: Once | ORAL | Status: DC | PRN

## 2019-02-13 MED ORDER — METHOCARBAMOL 500 MG PO TABS
500.0000 mg | ORAL_TABLET | Freq: Four times a day (QID) | ORAL | Status: DC | PRN
  Administered 2019-02-13 – 2019-02-14 (×2): 500 mg via ORAL
  Filled 2019-02-13 (×2): qty 1

## 2019-02-13 MED ORDER — TRANEXAMIC ACID-NACL 1000-0.7 MG/100ML-% IV SOLN
1000.0000 mg | INTRAVENOUS | Status: AC
  Administered 2019-02-13: 1000 mg via INTRAVENOUS
  Filled 2019-02-13: qty 100

## 2019-02-13 MED ORDER — VANCOMYCIN HCL IN DEXTROSE 1-5 GM/200ML-% IV SOLN
1000.0000 mg | INTRAVENOUS | Status: AC
  Administered 2019-02-13: 1000 mg via INTRAVENOUS
  Filled 2019-02-13: qty 200

## 2019-02-13 MED ORDER — PHENOL 1.4 % MT LIQD: 1.0000 | OROMUCOSAL | Status: DC | PRN

## 2019-02-13 SURGICAL SUPPLY — 45 items
BAG DECANTER FOR FLEXI CONT (MISCELLANEOUS) ×3 IMPLANT
BLADE SAW SGTL 18X1.27X75 (BLADE) ×2 IMPLANT
BLADE SAW SGTL 18X1.27X75MM (BLADE) ×1
BLADE SURG SZ10 CARB STEEL (BLADE) ×6 IMPLANT
CELLS DAT CNTRL 66122 CELL SVR (MISCELLANEOUS) ×1 IMPLANT
COVER PERINEAL POST (MISCELLANEOUS) ×3 IMPLANT
COVER SURGICAL LIGHT HANDLE (MISCELLANEOUS) ×3 IMPLANT
COVER WAND RF STERILE (DRAPES) IMPLANT
CUP ACET GRIPTION SERIS 56 100 (Trauma) IMPLANT
DECANTER SPIKE VIAL GLASS SM (MISCELLANEOUS) ×3 IMPLANT
DRAPE IMP U-DRAPE 54X76 (DRAPES) ×3 IMPLANT
DRAPE STERI IOBAN 125X83 (DRAPES) ×3 IMPLANT
DRAPE U-SHAPE 47X51 STRL (DRAPES) ×6 IMPLANT
DRSG AQUACEL AG ADV 3.5X10 (GAUZE/BANDAGES/DRESSINGS) ×3 IMPLANT
DURAPREP 26ML APPLICATOR (WOUND CARE) ×3 IMPLANT
ELECT BLADE TIP CTD 4 INCH (ELECTRODE) ×3 IMPLANT
ELECT REM PT RETURN 15FT ADLT (MISCELLANEOUS) ×3 IMPLANT
ELIMINATOR HOLE APEX DEPUY (Hips) ×2 IMPLANT
GIPTION SERIES 56 100 (Trauma) ×3 IMPLANT
GLOVE BIO SURGEON STRL SZ8 (GLOVE) ×6 IMPLANT
GLOVE BIOGEL PI IND STRL 8 (GLOVE) ×2 IMPLANT
GLOVE BIOGEL PI INDICATOR 8 (GLOVE) ×4
GOWN STRL REIN XL XLG (GOWN DISPOSABLE) ×6 IMPLANT
HEAD CERAMIC 36 PLUS 8.5 12 14 (Hips) ×2 IMPLANT
HEAD CERAMIC 36 PLUS5 (Hips) IMPLANT
HOLDER FOLEY CATH W/STRAP (MISCELLANEOUS) ×3 IMPLANT
KIT TURNOVER KIT A (KITS) IMPLANT
MANIFOLD NEPTUNE II (INSTRUMENTS) ×3 IMPLANT
NS IRRIG 1000ML POUR BTL (IV SOLUTION) ×3 IMPLANT
PACK ANTERIOR HIP CUSTOM (KITS) ×3 IMPLANT
PINNACLE ALTRX PLUS 4 N 36X56 (Hips) ×2 IMPLANT
PROTECTOR NERVE ULNAR (MISCELLANEOUS) ×3 IMPLANT
RETRACTOR WND ALEXIS 18 MED (MISCELLANEOUS) ×1 IMPLANT
RTRCTR WOUND ALEXIS 18CM MED (MISCELLANEOUS) ×3
STEM FEM ACTIS HIGH SZ7 (Stem) ×2 IMPLANT
SUT ETHIBOND NAB CT1 #1 30IN (SUTURE) ×6 IMPLANT
SUT VIC AB 1 CT1 36 (SUTURE) ×3 IMPLANT
SUT VIC AB 2-0 CT1 27 (SUTURE) ×3
SUT VIC AB 2-0 CT1 TAPERPNT 27 (SUTURE) ×1 IMPLANT
SUT VIC AB 3-0 PS2 18 (SUTURE) ×3
SUT VIC AB 3-0 PS2 18XBRD (SUTURE) ×1 IMPLANT
SUT VLOC 180 0 24IN GS25 (SUTURE) ×3 IMPLANT
SYR 50ML LL SCALE MARK (SYRINGE) ×3 IMPLANT
TRAY FOLEY MTR SLVR 16FR STAT (SET/KITS/TRAYS/PACK) ×3 IMPLANT
YANKAUER SUCT BULB TIP 10FT TU (MISCELLANEOUS) ×3 IMPLANT

## 2019-02-13 NOTE — Progress Notes (Signed)
RN hung scheduled dose of TXA per MD order post op and noticed large infiltration of IV.  RN spoke with pharmacist and discussed possible antidotes.  IV removed, infiltrate area assessed. No discoloration, only slight tenderness to the touch per pt.  Loni Dolly, PA paged and made aware.  RN will monitor.

## 2019-02-13 NOTE — Anesthesia Postprocedure Evaluation (Signed)
Anesthesia Post Note  Patient: Kyle [name]  Procedure(s) Performed: TOTAL HIP ARTHROPLASTY ANTERIOR APPROACH (Left )     Patient location during evaluation: PACU Anesthesia Type: Spinal Level of consciousness: awake and alert Pain management: pain level controlled Vital Signs Assessment: post-procedure vital signs reviewed and stable Respiratory status: spontaneous breathing, respiratory function stable and patient connected to nasal cannula oxygen Cardiovascular status: blood pressure returned to baseline and stable Postop Assessment: spinal receding and no apparent nausea or vomiting Anesthetic complications: no    Last Vitals:  Vitals:   02/13/19 1332 02/13/19 1426  BP: 134/74 126/69  Pulse: (!) 57 70  Resp: 18 18  Temp: (!) 36.3 C (!) 36.4 C  SpO2: 100% 100%    Last Pain:  Vitals:   02/13/19 1426  TempSrc: Axillary  PainSc:                  [name]

## 2019-02-13 NOTE — Transfer of Care (Signed)
Immediate Anesthesia Transfer of Care Note  Patient: Kyle [name]  Procedure(s) Performed: TOTAL HIP ARTHROPLASTY ANTERIOR APPROACH (Left )  Patient Location: PACU  Anesthesia Type:Spinal  Level of Consciousness: awake, alert  and oriented  Airway & Oxygen Therapy: Patient Spontanous Breathing and Patient connected to face mask oxygen  Post-op Assessment: Report given to RN and Post -op Vital signs reviewed and stable  Post vital signs: Reviewed and stable  Last Vitals:  Vitals Value Taken Time  BP 99/55 02/13/2019 12:10 PM  Temp    Pulse 65 02/13/2019 12:12 PM  Resp 15 02/13/2019 12:12 PM  SpO2 100 % 02/13/2019 12:12 PM  Vitals shown include unvalidated device data.  Last Pain:  Vitals:   02/13/19 0837  TempSrc:   PainSc: 3       Patients Stated Pain Goal: 5 (36/85/99 2341)  Complications: No apparent anesthesia complications

## 2019-02-13 NOTE — Discharge Instructions (Signed)
Information on my medicine - ELIQUIS (apixaban)  This medication education was reviewed with me or my healthcare representative as part of my discharge preparation.  The pharmacist that spoke with me during my hospital stay was:    Why was Eliquis prescribed for you? Eliquis was prescribed for you to reduce the risk of blood clots forming after orthopedic surgery.    What do You need to know about Eliquis? Take your Eliquis TWICE DAILY - one tablet in the morning and one tablet in the evening with or without food.  It would be best to take the dose about the same time each day.  If you have difficulty swallowing the tablet whole please discuss with your pharmacist how to take the medication safely.  Take Eliquis exactly as prescribed by your doctor and DO NOT stop taking Eliquis without talking to the doctor who prescribed the medication.  Stopping without other medication to take the place of Eliquis may increase your risk of developing a clot.  After discharge, you should have regular check-up appointments with your healthcare provider that is prescribing your Eliquis.  What do you do if you miss a dose? If a dose of ELIQUIS is not taken at the scheduled time, take it as soon as possible on the same day and twice-daily administration should be resumed.  The dose should not be doubled to make up for a missed dose.  Do not take more than one tablet of ELIQUIS at the same time.  Important Safety Information A possible side effect of Eliquis is bleeding. You should call your healthcare provider right away if you experience any of the following: Bleeding from an injury or your nose that does not stop. Unusual colored urine (red or dark brown) or unusual colored stools (red or black). Unusual bruising for unknown reasons. A serious fall or if you hit your head (even if there is no bleeding).  Some medicines may interact with Eliquis and might increase your risk of bleeding or  clotting while on Eliquis. To help avoid this, consult your healthcare provider or pharmacist prior to using any new prescription or non-prescription medications, including herbals, vitamins, non-steroidal anti-inflammatory drugs (NSAIDs) and supplements.  This website has more information on Eliquis (apixaban): http://www.eliquis.com/eliquis/home      Dr. Brian Swinteck Joint Replacement Specialist [facility] [address] , [place] [address] [phone]   TOTAL HIP REPLACEMENT POSTOPERATIVE DIRECTIONS    Hip Rehabilitation, Guidelines Following Surgery   WEIGHT BEARING Weight bearing as tolerated with assist device (walker, cane, etc) as directed, use it as long as suggested by your surgeon or therapist, typically at least 4-6 weeks.  The results of a hip operation are greatly improved after range of motion and muscle strengthening exercises. Follow all safety measures which are given to protect your hip. If any of these exercises cause increased pain or swelling in your joint, decrease the amount until you are comfortable again. Then slowly increase the exercises. Call your caregiver if you have problems or questions.   HOME CARE INSTRUCTIONS  Most of the following instructions are designed to prevent the dislocation of your new hip.  Remove items at home which could result in a fall. This includes throw rugs or furniture in walking pathways.  Continue medications as instructed at time of discharge. You may have some home medications which will be placed on hold until you complete the course of blood thinner medication. You may start showering once you are discharged home. 

## 2019-02-13 NOTE — Plan of Care (Signed)
Pt stable at time of pm assessment. No needs at this time. Rn to continue current plan of care. No changes to note overall.

## 2019-02-13 NOTE — Interval H&P Note (Signed)
History and Physical Interval Note:  02/13/2019 9:09 AM  Kyle [name]  has presented today for surgery, with the diagnosis of Left hip Degenerative Joint Disease.  The various methods of treatment have been discussed with the patient and family. After consideration of risks, benefits and other options for treatment, the patient has consented to  Procedure(s): TOTAL HIP ARTHROPLASTY ANTERIOR APPROACH (Left) as a surgical intervention.  The patient's history has been reviewed, patient examined, no change in status, stable for surgery.  I have reviewed the patient's chart and labs.  Questions were answered to the patient's satisfaction.     [name]

## 2019-02-13 NOTE — Evaluation (Signed)
Physical Therapy Evaluation Patient Details Name: Kyle [name] MRN: [id number] DOB: 06/30/1951 Today's Date: 02/13/2019   History of Present Illness  L DA-THA  Clinical Impression  Pt is s/p THA resulting in the deficits listed below (see PT Problem List). Pt ambulated 9' with RW with min/guard assist. Initiated THA HEP. Good progress expected. Pt is motivated and puts forth good effort.  Pt will benefit from skilled PT to increase their independence and safety with mobility to allow discharge to the venue listed below.      Follow Up Recommendations Follow surgeon's recommendation for DC plan and follow-up therapies    Equipment Recommendations  Rolling walker with 5" wheels;3in1 (PT)    Recommendations for Other Services       Precautions / Restrictions Precautions Precautions: Fall Restrictions Weight Bearing Restrictions: No Other Position/Activity Restrictions: WBAT      Mobility  Bed Mobility Overal bed mobility: Needs Assistance Bed Mobility: Supine to Sit     Supine to sit: Min guard     General bed mobility comments: used belt looped around L foot as leg lifter, VCs for technique  Transfers Overall transfer level: Needs assistance Equipment used: Rolling walker (2 wheeled) Transfers: Sit to/from Stand Sit to Stand: Min assist         General transfer comment: VCs hand placement, min A to rise  Ambulation/Gait Ambulation/Gait assistance: Min guard Gait Distance (Feet): 65 Feet Assistive device: Rolling walker (2 wheeled) Gait Pattern/deviations: Step-to pattern;Decreased stride length Gait velocity: decr   General Gait Details: VCs to increase step length, VCs sequencing  Stairs            Wheelchair Mobility    Modified Rankin (Stroke Patients Only)       Balance Overall balance assessment: Modified Independent                                           Pertinent Vitals/Pain Pain Assessment: 0-10 Pain  Score: 3  Pain Location: L hip Pain Descriptors / Indicators: Sore Pain Intervention(s): Limited activity within patient's tolerance;Monitored during session;Premedicated before session;Ice applied    Home Living Family/patient expects to be discharged to:: Private residence Living Arrangements: Spouse/significant other Available Help at Discharge: Family;Available 24 hours/day Type of Home: House Home Access: Stairs to enter Entrance Stairs-Rails: None Entrance Stairs-Number of Steps: 2 Home Layout: One level Home Equipment: None      Prior Function Level of Independence: Independent               Hand Dominance        Extremity/Trunk Assessment   Upper Extremity Assessment Upper Extremity Assessment: Overall WFL for tasks assessed    Lower Extremity Assessment Lower Extremity Assessment: LLE deficits/detail LLE Deficits / Details: L hip AAROM decreased ~50% limited by pain, baseline decreased sensation to L foot 2* spinal issues per pt LLE Sensation: history of peripheral neuropathy    Cervical / Trunk Assessment Cervical / Trunk Assessment: Normal  Communication   Communication: No difficulties  Cognition Arousal/Alertness: Awake/alert Behavior During Therapy: WFL for tasks assessed/performed Overall Cognitive Status: Within Functional Limits for tasks assessed                                        General Comments  Exercises Total Joint Exercises Ankle Circles/Pumps: AROM;Both;10 reps;Supine Quad Sets: AROM;Both;5 reps;Supine Heel Slides: AAROM;Left;5 reps;Supine Hip ABduction/ADduction: AAROM;Left;5 reps;Supine Long Arc Quad: AROM;Left;5 reps;Seated   Assessment/Plan    PT Assessment Patient needs continued PT services  PT Problem List Decreased strength;Decreased activity tolerance;Decreased mobility;Pain;Decreased knowledge of use of DME       PT Treatment Interventions DME instruction;Gait training;Stair  training;Functional mobility training;Therapeutic activities;Therapeutic exercise;Patient/family education    PT Goals (Current goals can be found in the Care Plan section)  Acute Rehab PT Goals Patient Stated Goal: play golf PT Goal Formulation: With patient Time For Goal Achievement: 02/20/19 Potential to Achieve Goals: Good    Frequency 7X/week   Barriers to discharge        Co-evaluation               AM-PAC PT "6 Clicks" Mobility  Outcome Measure Help needed turning from your back to your side while in a flat bed without using bedrails?: A Little Help needed moving from lying on your back to sitting on the side of a flat bed without using bedrails?: A Little Help needed moving to and from a bed to a chair (including a wheelchair)?: A Little Help needed standing up from a chair using your arms (e.g., wheelchair or bedside chair)?: A Little Help needed to walk in hospital room?: A Little Help needed climbing 3-5 steps with a railing? : A Lot 6 Click Score: 17    End of Session Equipment Utilized During Treatment: Gait belt Activity Tolerance: Patient tolerated treatment well Patient left: in chair;with call bell/phone within reach;with chair alarm set Nurse Communication: Mobility status PT Visit Diagnosis: Pain Pain - Right/Left: Left Pain - part of body: Hip    Time: 3254-9826 PT Time Calculation (min) (ACUTE ONLY): 41 min   Charges:   PT Evaluation $PT Eval Low Complexity: 1 Low PT Treatments $Gait Training: 8-22 mins $Therapeutic Exercise: 8-22 mins        Blondell Reveal Kistler PT 02/13/2019  Acute Rehabilitation Services Pager [phone] Office [phone]

## 2019-02-13 NOTE — Care Plan (Signed)
Ortho Bundle Case Management Note  Patient Details  Name: Kyle [name] MRN: [id number] Date of Birth: [birth year]   Spoke with patient prior to surgery. He plans to discharge to home with family and go to [place]. Rolling walker has been ordered for him from [place] to be delivered to hospital prior to discharge. Patient and MD in agreement with plan. CHoice offered.                    DME Arranged:  3-N-1, Walker rolling DME Agency:  [employer]  HH Arranged:    [place] Agency:     Additional Comments: Please contact me with any questions of if this plan should need to change.  Ladell Heads,  [place] [facility]  [phone] 02/13/2019, 12:52 PM

## 2019-02-13 NOTE — Anesthesia Procedure Notes (Signed)
Spinal  Patient location during procedure: OR Start time: 02/13/2019 10:08 AM End time: 02/13/2019 10:10 AM Staffing Resident/CRNA: [place] Issabela Lesko C, CRNA Performed: resident/CRNA  Preanesthetic Checklist Completed: patient identified, site marked, surgical consent, pre-op evaluation, timeout performed, IV checked, risks and benefits discussed and monitors and equipment checked Spinal Block Patient position: sitting Prep: site prepped and draped and DuraPrep Patient monitoring: heart rate, cardiac monitor, continuous pulse ox and blood pressure Approach: midline Location: L3-4 Injection technique: single-shot Needle Needle type: Pencan  Needle gauge: 24 G Needle length: 9 cm Assessment Sensory level: T6 Additional Notes IV functioning, monitors applied to pt. Expiration date of kit checked and confirmed to be in date. Sterile prep and drape, hand hygiene and sterile gloved used. Pt was positioned and spine was prepped in sterile fashion. Skin was anesthetized with lidocaine. Free flow of clear CSF obtained prior to injecting local anesthetic into CSF x 1 attempt. Spinal needle aspirated freely following injection. Needle was carefully withdrawn, and pt tolerated procedure well. Loss of motor and sensory on exam post injection.

## 2019-02-13 NOTE — Anesthesia Preprocedure Evaluation (Addendum)
Anesthesia Evaluation  Patient identified by MRN, date of birth, ID band Patient awake    Reviewed: Allergy & Precautions, NPO status , Patient's Chart, lab work & pertinent test results  History of Anesthesia Complications (+) PROLONGED EMERGENCE and history of anesthetic complications  Airway Mallampati: I  TM Distance: >3 FB Neck ROM: Full    Dental  (+) Dental Advisory Given, Teeth Intact   Pulmonary asthma ,    breath sounds clear to auscultation       Cardiovascular Exercise Tolerance: Good negative cardio ROS   Rhythm:Regular Rate:Normal     Neuro/Psych  Headaches, negative psych ROS   GI/Hepatic Neg liver ROS, GERD  Medicated and Controlled,  Endo/Other  negative endocrine ROS  Renal/GU negative Renal ROS    Prostate cancer     Musculoskeletal negative musculoskeletal ROS (+)   Abdominal   Peds  Hematology negative hematology ROS (+)   Anesthesia Other Findings   Reproductive/Obstetrics                            Anesthesia Physical Anesthesia Plan  ASA: II  Anesthesia Plan: Spinal   Post-op Pain Management:    Induction:   PONV Risk Score and Plan: 1 and Treatment may vary due to age or medical condition and Propofol infusion  Airway Management Planned: Natural Airway and Simple Face Mask  Additional Equipment: None  Intra-op Plan:   Post-operative Plan:   Informed Consent: I have reviewed the patients History and Physical, chart, labs and discussed the procedure including the risks, benefits and alternatives for the proposed anesthesia with the patient or authorized representative who has indicated his/her understanding and acceptance.       Plan Discussed with: CRNA and Anesthesiologist  Anesthesia Plan Comments: (Labs reviewed, platelets acceptable. Discussed risks and benefits of spinal, including spinal/epidural hematoma, infection, failed block, and  PDPH. Patient expressed understanding and wished to proceed. )       Anesthesia Quick Evaluation

## 2019-02-13 NOTE — Op Note (Signed)
PRE-OP DIAGNOSIS:  LEFT HIP DEGENERATIVE JOINT DISEASE POST-OP DIAGNOSIS: same PROCEDURE:  LEFT TOTAL HIP ARTHROPLASTY ANTERIOR APPROACH ANESTHESIA:  Spinal and MAC SURGEON:  Melrose Nakayama MD ASSISTANT:  Loni Dolly PA-C   INDICATIONS FOR PROCEDURE:  The patient is a 68 y.o. male with a long history of a painful hip.  This has persisted despite multiple conservative measures.  The patient has persisted with pain and dysfunction making rest and activity difficult.  A total hip replacement is offered as surgical treatment.  Informed operative consent was obtained after discussion of possible complications including reaction to anesthesia, infection, neurovascular injury, dislocation, DVT, PE, and death.  The importance of the postoperative rehab program to optimize result was stressed with the patient.  SUMMARY OF FINDINGS AND PROCEDURE:  Under the above anesthesia through a anterior approach an the Hana table a left THR was performed.  The patient had severe degenerative change and excellent bone quality.  We used DePuy components to replace the hip and these were size 7 high offset Actis femur capped with a +8.5 62m ceramic hip ball.  On the acetabular side we used a size 56 Gription shell with a plus 4 neutral polyethylene liner.  We did use a hole eliminator.  ALoni DollyPA-C assisted throughout and was invaluable to the completion of the case in that he helped position and retract while I performed the procedure.  He also closed simultaneously to help minimize OR time.  I used fluoroscopy throughout the case to check position of components and leg lengths and read all these views myself.  DESCRIPTION OF PROCEDURE:  The patient was taken to the OR suite where the above anesthetic was applied.  The patient was then positioned on the Hana table supine.  All bony prominences were appropriately padded.  Prep and drape was then performed in normal sterile fashion.  The patient was given vancomycin  preoperative antibiotic and an appropriate time out was performed.  We then took an anterior approach to the left hip.  Dissection was taken through adipose to the tensor fascia lata fascia.  This structure was incised longitudinally and we dissected in the intermuscular interval just medial to this muscle.  Cobra retractors were placed superior and inferior to the femoral neck superficial to the capsule.  A capsular incision was then made and the retractors were placed along the femoral neck.  Xray was brought in to get a good level for the femoral neck cut which was made with an oscillating saw and osteotome.  The femoral head was removed with a corkscrew.  The acetabulum was exposed and some labral tissues were excised. Reaming was taken to the inside wall of the pelvis and sequentially up to 1 mm smaller than the actual component.  A trial of components was done and then the aforementioned acetabular shell was placed in appropriate tilt and anteversion confirmed by fluoroscopy. The liner was placed along with the hole eliminator and attention was turned to the femur.  The leg was brought down and over into adduction and the elevator bar was used to raise the femur up gently in the wound.  The piriformis was released with care taken to preserve the obturator internus attachment and all of the posterior capsule. The femur was reamed and then broached to the appropriate size.  A trial reduction was done and the aforementioned head and neck assembly gave u[place]oreathe best stability in extension with external rotation.  Leg lengths were felt to be about equal  by fluoroscopic exam.  The trial components were removed and the wound irrigated.  We then placed the femoral component in appropriate anteversion.  The head was applied to a dry stem neck and the hip again reduced.  It was again stable in the aforementioned position.  The would was irrigated again followed by re-approximation of anterior capsule with ethibond suture.  Tensor fascia was repaired with V-loc suture  followed by deep closure with #O and #2 undyed vicryl.  Skin was closed with subQ stitch and steristrips followed by a sterile dressing.  EBL and IOF can be obtained from anesthesia records.  DISPOSITION:  The patient was extubated in the OR and taken to PACU in stable condition to be admitted to the Orthopedic Surgery for appropriate post-op care to include perioperative antibiotics and DVT prophylaxis.

## 2019-02-14 ENCOUNTER — Ambulatory Visit: Payer: Self-pay

## 2019-02-14 ENCOUNTER — Encounter (HOSPITAL_COMMUNITY): Payer: Self-pay | Admitting: Orthopaedic Surgery

## 2019-02-14 DIAGNOSIS — Z880 Allergy status to penicillin: Secondary | ICD-10-CM | POA: Diagnosis not present

## 2019-02-14 DIAGNOSIS — K219 Gastro-esophageal reflux disease without esophagitis: Secondary | ICD-10-CM | POA: Diagnosis not present

## 2019-02-14 DIAGNOSIS — Z881 Allergy status to other antibiotic agents status: Secondary | ICD-10-CM | POA: Diagnosis not present

## 2019-02-14 DIAGNOSIS — Z8546 Personal history of malignant neoplasm of prostate: Secondary | ICD-10-CM | POA: Diagnosis not present

## 2019-02-14 DIAGNOSIS — M1612 Unilateral primary osteoarthritis, left hip: Secondary | ICD-10-CM | POA: Diagnosis not present

## 2019-02-14 DIAGNOSIS — Z96659 Presence of unspecified artificial knee joint: Secondary | ICD-10-CM | POA: Diagnosis not present

## 2019-02-14 MED ORDER — HYDROCODONE-ACETAMINOPHEN 5-325 MG PO TABS: 1.0000 | ORAL_TABLET | Freq: Four times a day (QID) | ORAL | 0 refills | Status: DC | PRN

## 2019-02-14 MED ORDER — TIZANIDINE HCL 4 MG PO TABS: 4.0000 mg | ORAL_TABLET | Freq: Four times a day (QID) | ORAL | 1 refills | Status: DC | PRN

## 2019-02-14 MED ORDER — APIXABAN 2.5 MG PO TABS: 2.5000 mg | ORAL_TABLET | Freq: Two times a day (BID) | ORAL | 0 refills | Status: DC

## 2019-02-14 NOTE — Progress Notes (Signed)
Subjective: 1 Day Post-Op Procedure(s) (LRB): TOTAL HIP ARTHROPLASTY ANTERIOR APPROACH (Left)   Patient feels well. Nor real pain. He is looking forward to going home.    Activity level:  wbat Diet tolerance:  ok Voiding:  ok Patient reports pain as mild.    Objective: Vital signs in last 24 hours: Temp:  [97.2 F (36.2 C)-98 F (36.7 C)] 98 F (36.7 C) (06/03 0518) Pulse Rate:  [54-88] 66 (06/03 0518) Resp:  [10-20] 18 (06/03 0518) BP: (99-148)/(55-84) 129/60 (06/03 0518) SpO2:  [96 %-100 %] 98 % (06/03 0518) Weight:  [91.7 kg] 91.7 kg (06/02 0837)  Labs: No results for input(s): HGB in the last 72 hours. No results for input(s): WBC, RBC, HCT, PLT in the last 72 hours. No results for input(s): NA, K, CL, CO2, BUN, CREATININE, GLUCOSE, CALCIUM in the last 72 hours. No results for input(s): LABPT, INR in the last 72 hours.  Physical Exam:  Neurologically intact ABD soft Neurovascular intact Sensation intact distally Intact pulses distally Dorsiflexion/Plantar flexion intact Incision: dressing C/D/I and no drainage No cellulitis present Compartment soft  Assessment/Plan:  1 Day Post-Op Procedure(s) (LRB): TOTAL HIP ARTHROPLASTY ANTERIOR APPROACH (Left) Advance diet Up with therapy D/C IV fluids Discharge home with home health Today after PT. Eliquis 2.5mg  BID for dvt prevention. Follow up in office 2 week post op.  Kyle [name] Kyle [name] 02/14/2019, 7:42 AM

## 2019-02-14 NOTE — Care Management Obs Status (Signed)
[place] NOTIFICATION   Patient Details  Name: Kyle [name] MRN: [id number] Date of Birth: 1951-08-18   Medicare Observation Status Notification Given:  Yes    Lia Hopping, [place] 02/14/2019, 2:32 PM

## 2019-02-14 NOTE — Discharge Summary (Signed)
Patient ID: Kyle [name] MRN: [id number] DOB/AGE: 02/20/1951 68 y.o.  Admit date: 02/13/2019 Discharge date: 02/14/2019  Admission Diagnoses:  Principal Problem:   Primary osteoarthritis of left hip   Discharge Diagnoses:  Same  Past Medical History:  Diagnosis Date  . Asthma   . Cluster headache   . Complication of anesthesia 1990   Difficult to arouse and bronchospasm  . Prostate cancer ([facility]) 2020  . Reflux   . Sinusitis     Surgeries: Procedure(s): TOTAL HIP ARTHROPLASTY ANTERIOR APPROACH on 02/13/2019   Consultants:   Discharged Condition: Improved  Hospital Course: Kyle [name] is an 68 y.o. male who was admitted 02/13/2019 for operative treatment ofPrimary osteoarthritis of left hip. Patient has severe unremitting pain that affects sleep, daily activities, and work/hobbies. After pre-op clearance the patient was taken to the operating room on 02/13/2019 and underwent  Procedure(s): TOTAL HIP ARTHROPLASTY ANTERIOR APPROACH.    Patient was given perioperative antibiotics:  Anti-infectives (From admission, onward)   Start     Dose/Rate Route Frequency Ordered Stop   02/13/19 2130  vancomycin (VANCOCIN) IVPB 1000 mg/200 mL premix  Status:  Discontinued     1,000 mg 200 mL/hr over 60 Minutes Intravenous Every 12 hours 02/13/19 1327 02/13/19 1353   02/13/19 2130  vancomycin (VANCOCIN) 1,000 mg in sodium chloride 0.9 % 250 mL IVPB     1,000 mg 250 mL/hr over 60 Minutes Intravenous Every 12 hours 02/13/19 1353 02/14/19 0046   02/13/19 0815  vancomycin (VANCOCIN) IVPB 1000 mg/200 mL premix     1,000 mg 200 mL/hr over 60 Minutes Intravenous On call to O.R. 02/13/19 1540 02/13/19 1017       Patient was given sequential compression devices, early ambulation, and chemoprophylaxis to prevent DVT.  Patient benefited maximally from hospital stay and there were no complications.    Recent vital signs:  Patient Vitals for the past 24 hrs:  BP Temp Temp src Pulse  Resp SpO2 Height Weight  02/14/19 0518 129/60 98 F (36.7 C) Oral 66 18 98 % - -  02/14/19 0200 120/64 98 F (36.7 C) Oral 69 16 97 % - -  02/13/19 2200 120/65 97.7 F (36.5 C) Oral 70 16 100 % - -  02/13/19 1944 - - - - - 96 % - -  02/13/19 1630 127/69 (!) 97.5 F (36.4 C) Oral 88 18 97 % - -  02/13/19 1538 130/66 (!) 97.4 F (36.3 C) Oral 78 18 100 % - -  02/13/19 1426 126/69 (!) 97.5 F (36.4 C) Axillary 70 18 100 % - -  02/13/19 1332 134/74 (!) 97.4 F (36.3 C) Oral (!) 57 18 100 % - -  02/13/19 1315 133/67 97.7 F (36.5 C) - (!) 54 16 100 % - -  02/13/19 1300 128/67 - - (!) 55 10 100 % - -  02/13/19 1245 120/67 - - (!) 57 14 100 % - -  02/13/19 1230 112/64 - - (!) 59 19 98 % - -  02/13/19 1215 113/63 - - 65 20 100 % - -  02/13/19 1211 (!) 99/55 (!) 97.2 F (36.2 C) - 65 18 100 % - -  02/13/19 0867 - - - - - - 6\' 1"  (1.854 m) 91.7 kg  02/13/19 0810 (!) 148/84 97.9 F (36.6 C) Oral 84 18 97 % - -     Recent laboratory studies: No results for input(s): WBC, HGB, HCT, PLT, NA, K, CL, CO2, BUN, CREATININE, GLUCOSE,  INR, CALCIUM in the last 72 hours.  Invalid input(s): PT, 2   Discharge Medications:   Allergies as of 02/14/2019      Reactions   Augmentin [amoxicillin-pot Clavulanate]    Terrible heartburn Did it involve swelling of the face/tongue/throat, SOB, or low BP? No Did it involve sudden or severe rash/hives, skin peeling, or any reaction on the inside of your mouth or nose? No Did you need to seek medical attention at a hospital or doctor's office? No When did it last happen?2 years If all above answers are "NO", may proceed with cephalosporin use.   Levaquin [levofloxacin In D5w]    Terrible heartburn      Medication List    STOP taking these medications   ibuprofen 200 MG tablet Commonly known as:  ADVIL     TAKE these medications   albuterol (2.5 MG/3ML) 0.083% nebulizer solution Commonly known as:  PROVENTIL Take 2.5 mg by nebulization every 4  (four) hours as needed for wheezing or shortness of breath. What changed:  Another medication with the same name was changed. Make sure you understand how and when to take each.   albuterol 108 (90 Base) MCG/ACT inhaler Commonly known as:  ProAir HFA Inhale two puffs every 4-6 hours if needed for cough or wheeze. What changed:    how much to take  how to take this  when to take this  reasons to take this  additional instructions   apixaban 2.5 MG Tabs tablet Commonly known as:  ELIQUIS Take 1 tablet (2.5 mg total) by mouth 2 (two) times daily.   beclomethasone 80 MCG/ACT inhaler Commonly known as:  Qvar RediHaler Inhale two doses twice daily during asthma flare-up.  Rinse, gargle, and spit after use. What changed:    how much to take  how to take this  when to take this  reasons to take this  additional instructions   budesonide-formoterol 160-4.5 MCG/ACT inhaler Commonly known as:  Symbicort USE 2 INHALATIONS ORALLY TWICE DAILY TO PREVENT COUGH OR WHEEZE. RINSE, GARGLE AND SPIT AFTER USE What changed:    how much to take  how to take this  when to take this  additional instructions   EPINEPHrine 0.3 mg/0.3 mL Soaj injection Commonly known as:  EPI-PEN Use for life threatening allergic reactions   esomeprazole 40 MG capsule Commonly known as:  NexIUM Take 1 capsule (40 mg total) by mouth daily. Can increase to twice a day during flare-up. What changed:    when to take this  reasons to take this  additional instructions   GARLIC PO Take 1 tablet by mouth daily.   GINGER PO Take 1 tablet by mouth daily.   HYDROcodone-acetaminophen 5-325 MG tablet Commonly known as:  NORCO/VICODIN Take 1-2 tablets by mouth every 6 (six) hours as needed for moderate pain (pain score 4-6).   hydrOXYzine 25 MG capsule Commonly known as:  VISTARIL Take 25 mg by mouth daily as needed (frequent urination).   loratadine 10 MG tablet Commonly known as:   CLARITIN Take 10 mg by mouth daily as needed for allergies.   LUBRICATING EYE DROPS OP Place 1 drop into both eyes daily as needed (irritation).   Melatonin 5 MG Caps Take 5 mg by mouth at bedtime as needed (sleep).   mometasone 50 MCG/ACT nasal spray Commonly known as:  NASONEX Use 1-2 sprays in each nostril once daily. What changed:    how much to take  how to take this  when to take this  additional instructions   MULTIVITAMIN PO Take 1 tablet by mouth daily.   tiZANidine 4 MG tablet Commonly known as:  Zanaflex Take 1 tablet (4 mg total) by mouth every 6 (six) hours as needed.   Turmeric Curcumin Caps Take 1 capsule by mouth daily.   vitamin C 1000 MG tablet Take 1,000 mg by mouth daily.            Durable Medical Equipment  (From admission, onward)         Start     Ordered   02/13/19 1328  DME Walker rolling  Once    Question:  Patient needs a walker to treat with the following condition  Answer:  Primary osteoarthritis of left hip   02/13/19 1327   02/13/19 1328  DME 3 n 1  Once     02/13/19 1327   02/13/19 1328  DME Bedside commode  Once    Question:  Patient needs a bedside commode to treat with the following condition  Answer:  Primary osteoarthritis of left hip   02/13/19 1327          Diagnostic Studies: Dg Chest 2 View  Result Date: 02/07/2019 CLINICAL DATA:  Prostate carcinoma.  Pre-op respiratory exam EXAM: CHEST - 2 VIEW COMPARISON:  02/17/2012 FINDINGS: The heart size and mediastinal contours are within normal limits. Pulmonary hyperinflation is again seen, consistent with COPD. Branching opacity is again seen retrocardiac left lower lobe, without change since previous study. This consistent with a chronic bronchocele. No evidence of acute infiltrate or edema. No evidence of pleural effusion. IMPRESSION: 1. Stable COPD and chronic left lower lobe bronchocele. 2. No active cardiopulmonary disease. Electronically Signed   By: Earle Gell  M.D.   On: 02/07/2019 09:23   Dg C-arm 1-60 Min-no Report  Result Date: 02/13/2019 Fluoroscopy was utilized by the requesting physician.  No radiographic interpretation.   Dg Hip Operative Unilat With Pelvis Left  Result Date: 02/13/2019 CLINICAL DATA:  Prior surgery. EXAM: OPERATIVE LEFT HIP (WITH PELVIS IF PERFORMED) 3 VIEWS TECHNIQUE: Fluoroscopic spot image(s) were submitted for interpretation post-operatively. COMPARISON:  No prior. FINDINGS: Total left hip replacement. Hardware intact. Anatomic alignment. No acute bony abnormality. IMPRESSION: Total left hip replacement with anatomic alignment. No acute bony abnormality. Electronically Signed   By: Marcello Moores  Register   On: 02/13/2019 12:13    Disposition: Discharge disposition: 01-Home or Self Care       Discharge Instructions    Call MD / Call 911   Complete by:  As directed    If you experience chest pain or shortness of breath, CALL 911 and be transported to the hospital emergency room.  If you develope a fever above 101 F, pus (white drainage) or increased drainage or redness at the wound, or calf pain, call your surgeon's office.   Constipation Prevention   Complete by:  As directed    Drink plenty of fluids.  Prune juice may be helpful.  You may use a stool softener, such as Colace (over the counter) 100 mg twice a day.  Use MiraLax (over the counter) for constipation as needed.   Diet - low sodium heart healthy   Complete by:  As directed    Discharge instructions   Complete by:  As directed    INSTRUCTIONS AFTER JOINT REPLACEMENT   Remove items at home which could result in a fall. This includes throw rugs or furniture in walking pathways ICE to the  affected joint every three hours while awake for 30 minutes at a time, for at least the first 3-5 days, and then as needed for pain and swelling.  Continue to use ice for pain and swelling. You may notice swelling that will progress down to the foot and ankle.  This is normal  after surgery.  Elevate your leg when you are not up walking on it.   Continue to use the breathing machine you got in the hospital (incentive spirometer) which will help keep your temperature down.  It is common for your temperature to cycle up and down following surgery, especially at night when you are not up moving around and exerting yourself.  The breathing machine keeps your lungs expanded and your temperature down.   DIET:  As you were doing prior to hospitalization, we recommend a well-balanced diet.  DRESSING / WOUND CARE / SHOWERING  You may shower 3 days after surgery, but keep the wounds dry during showering.  You may use an occlusive plastic wrap (Press'n Seal for example), NO SOAKING/SUBMERGING IN THE BATHTUB.  If the bandage gets wet, change with a clean dry gauze.  If the incision gets wet, pat the wound dry with a clean towel.  ACTIVITY  Increase activity slowly as tolerated, but follow the weight bearing instructions below.   No driving for 6 weeks or until further direction given by your physician.  You cannot drive while taking narcotics.  No lifting or carrying greater than 10 lbs. until further directed by your surgeon. Avoid periods of inactivity such as sitting longer than an hour when not asleep. This helps prevent blood clots.  You may return to work once you are authorized by your doctor.     WEIGHT BEARING   Weight bearing as tolerated with assist device (walker, cane, etc) as directed, use it as long as suggested by your surgeon or therapist, typically at least 4-6 weeks.   EXERCISES  Results after joint replacement surgery are often greatly improved when you follow the exercise, range of motion and muscle strengthening exercises prescribed by your doctor. Safety measures are also important to protect the joint from further injury. Any time any of these exercises cause you to have increased pain or swelling, decrease what you are doing until you are  comfortable again and then slowly increase them. If you have problems or questions, call your caregiver or physical therapist for advice.   Rehabilitation is important following a joint replacement. After just a few days of immobilization, the muscles of the leg can become weakened and shrink (atrophy).  These exercises are designed to build up the tone and strength of the thigh and leg muscles and to improve motion. Often times heat used for twenty to thirty minutes before working out will loosen up your tissues and help with improving the range of motion but do not use heat for the first two weeks following surgery (sometimes heat can increase post-operative swelling).   These exercises can be done on a training (exercise) mat, on the floor, on a table or on a bed. Use whatever works the best and is most comfortable for you.    Use music or television while you are exercising so that the exercises are a pleasant break in your day. This will make your life better with the exercises acting as a break in your routine that you can look forward to.   Perform all exercises about fifteen times, three times per day or as directed.  You should exercise both the operative leg and the other leg as well.   Exercises include:   Quad Sets - Tighten up the muscle on the front of the thigh (Quad) and hold for 5-10 seconds.   Straight Leg Raises - With your knee straight (if you were given a brace, keep it on), lift the leg to 60 degrees, hold for 3 seconds, and slowly lower the leg.  Perform this exercise against resistance later as your leg gets stronger.  Leg Slides: Lying on your back, slowly slide your foot toward your buttocks, bending your knee up off the floor (only go as far as is comfortable). Then slowly slide your foot back down until your leg is flat on the floor again.  Angel Wings: Lying on your back spread your legs to the side as far apart as you can without causing discomfort.  Hamstring Strength:   Lying on your back, push your heel against the floor with your leg straight by tightening up the muscles of your buttocks.  Repeat, but this time bend your knee to a comfortable angle, and push your heel against the floor.  You may put a pillow under the heel to make it more comfortable if necessary.   A rehabilitation program following joint replacement surgery can speed recovery and prevent re-injury in the future due to weakened muscles. Contact your doctor or a physical therapist for more information on knee rehabilitation.    CONSTIPATION  Constipation is defined medically as fewer than three stools per week and severe constipation as less than one stool per week.  Even if you have a regular bowel pattern at home, your normal regimen is likely to be disrupted due to multiple reasons following surgery.  Combination of anesthesia, postoperative narcotics, change in appetite and fluid intake all can affect your bowels.   YOU MUST use at least one of the following options; they are listed in order of increasing strength to get the job done.  They are all available over the counter, and you may need to use some, POSSIBLY even all of these options:    Drink plenty of fluids (prune juice may be helpful) and high fiber foods Colace 100 mg by mouth twice a day  Senokot for constipation as directed and as needed Dulcolax (bisacodyl), take with full glass of water  Miralax (polyethylene glycol) once or twice a day as needed.  If you have tried all these things and are unable to have a bowel movement in the first 3-4 days after surgery call either your surgeon or your primary doctor.    If you experience loose stools or diarrhea, hold the medications until you stool forms back up.  If your symptoms do not get better within 1 week or if they get worse, check with your doctor.  If you experience "the worst abdominal pain ever" or develop nausea or vomiting, please contact the office immediately for further  recommendations for treatment.   ITCHING:  If you experience itching with your medications, try taking only a single pain pill, or even half a pain pill at a time.  You can also use Benadryl over the counter for itching or also to help with sleep.   TED HOSE STOCKINGS:  Use stockings on both legs until for at least 2 weeks or as directed by physician office. They may be removed at night for sleeping.  MEDICATIONS:  See your medication summary on the "After Visit Summary" that nursing will review with  you.  You may have some home medications which will be placed on hold until you complete the course of blood thinner medication.  It is important for you to complete the blood thinner medication as prescribed.  PRECAUTIONS:  If you experience chest pain or shortness of breath - call 911 immediately for transfer to the hospital emergency department.   If you develop a fever greater that 101 F, purulent drainage from wound, increased redness or drainage from wound, foul odor from the wound/dressing, or calf pain - CONTACT YOUR SURGEON.                                                   FOLLOW-UP APPOINTMENTS:  If you do not already have a post-op appointment, please call the office for an appointment to be seen by your surgeon.  Guidelines for how soon to be seen are listed in your "After Visit Summary", but are typically between 1-4 weeks after surgery.  OTHER INSTRUCTIONS:   Knee Replacement:  Do not place pillow under knee, focus on keeping the knee straight while resting. CPM instructions: 0-90 degrees, 2 hours in the morning, 2 hours in the afternoon, and 2 hours in the evening. Place foam block, curve side up under heel at all times except when in CPM or when walking.  DO NOT modify, tear, cut, or change the foam block in any way.  MAKE SURE YOU:  Understand these instructions.  Get help right away if you are not doing well or get worse.    Thank you for letting us be a part of your medical  care team.  It is a privilege we respect greatly.  We hope these instructions will help you stay on track for a fast and full recovery!   Increase activity slowly as tolerated   Complete by:  As directed       Follow-up Information    Melrose Nakayama, MD. Go on 02/23/2019.   Specialty:  Orthopedic Surgery Why:  Your appointment has been scheduled for 9:45  Contact information: [address] [place] [address] [phone]        [facility] Go on 02/16/2019.   Why:  You are scheduled to start Outpatient physical therapy at 10:00. Please arrive a few minutes early to complete your paperwork  Contact information: [place]   [phone]           Signed: Larwance Sachs Kayia Billinger 02/14/2019, 7:47 AM

## 2019-02-14 NOTE — Care Management CC44 (Signed)
Condition Code 44 Documentation Completed  Patient Details  Name: LETICIA MCDIARMID MRN: [id number] Date of Birth: 18-Jan-1951   Condition Code 44 given:  Yes Patient signature on Condition Code 44 notice:  Yes Documentation of 2 MD's agreement:  Yes Code 44 added to claim:  Yes    Lia Hopping, LCSW 02/14/2019, 11:12 AM

## 2019-02-14 NOTE — Progress Notes (Signed)
Physical Therapy Treatment Patient Details Name: Kyle [name] MRN: [id number] DOB: 24-Apr-1951 Today's Date: 02/14/2019    History of Present Illness L DA-THA    PT Comments    POD # 1 am session Assisted OOB to amb in hallway and practice stairs.  General bed mobility comments: demonstarted and instructed how to use a belt to self assist LE.  General transfer comment: 255 VC's on safety with turns and hand placement with stand to sit.  General Gait Details: VCs to increase step length, VCs sequencing.  Then returned to room to perform some TE's following HEP handout.  Instructed on proper tech, freq as well as use of ICE.   Addressed all mobility questions, discussed appropriate activity, educated on use of ICE.  Pt ready for D/C to home.   Follow Up Recommendations  Follow surgeon's recommendation for DC plan and follow-up therapies(HEP)     Equipment Recommendations  Rolling walker with 5" wheels;3in1 (PT)    Recommendations for Other Services       Precautions / Restrictions Precautions Precautions: Fall Restrictions Weight Bearing Restrictions: No Other Position/Activity Restrictions: WBAT    Mobility  Bed Mobility Overal bed mobility: Independent       Supine to sit: Supervision     General bed mobility comments: demonstarted and instructed how to use a belt to self assist LE  Transfers Overall transfer level: Needs assistance Equipment used: Rolling walker (2 wheeled) Transfers: Sit to/from [employer] Transfers Sit to Stand: Supervision;Min guard Stand pivot transfers: Min guard       General transfer comment: 255 VC's on safety with turns and hand placement with stand to sit  Ambulation/Gait Ambulation/Gait assistance: Min guard Gait Distance (Feet): 75 Feet Assistive device: Rolling walker (2 wheeled) Gait Pattern/deviations: Step-to pattern;Decreased stride length Gait velocity: decr   General Gait Details: VCs to increase step  length, VCs sequencing   Stairs Stairs: Yes Stairs assistance: Min guard;Supervision Stair Management: No rails;Step to pattern;Forwards;With walker Number of Stairs: 2 General stair comments: 50% VC's on proper tech and walker placement.  Performed twice   Wheelchair Mobility    Modified Rankin (Stroke Patients Only)       Balance                                            Cognition Arousal/Alertness: Awake/alert Behavior During Therapy: WFL for tasks assessed/performed Overall Cognitive Status: Within Functional Limits for tasks assessed                                        Exercises   Total Hip Replacement TE's 10 reps ankle pumps 10 reps knee presses 10 reps heel slides 10 reps SAQ's 10 reps ABD 10 reps LAQ's Reviewed all standing TE's Followed by ICE     General Comments        Pertinent Vitals/Pain Pain Assessment: 0-10 Pain Score: 6  Pain Location: L hip Pain Descriptors / Indicators: Sore;Tender;Tightness;Operative site guarding Pain Intervention(s): Monitored during session;Premedicated before session;Repositioned;Ice applied    Home Living                      Prior Function            PT Goals (current goals can now be  found in the care plan section) Progress towards PT goals: Progressing toward goals    Frequency    7X/week      PT Plan Current plan remains appropriate    Co-evaluation              AM-PAC PT "6 Clicks" Mobility   Outcome Measure  Help needed turning from your back to your side while in a flat bed without using bedrails?: A Little Help needed moving from lying on your back to sitting on the side of a flat bed without using bedrails?: A Little Help needed moving to and from a bed to a chair (including a wheelchair)?: A Little Help needed standing up from a chair using your arms (e.g., wheelchair or bedside chair)?: A Little Help needed to walk in hospital  room?: A Little Help needed climbing 3-5 steps with a railing? : A Lot 6 Click Score: 17    End of Session Equipment Utilized During Treatment: Gait belt Activity Tolerance: Patient tolerated treatment well Patient left: in chair;with call bell/phone within reach;with chair alarm set Nurse Communication: Mobility status PT Visit Diagnosis: Pain Pain - Right/Left: Left Pain - part of body: Hip     Time: 9977-4142 PT Time Calculation (min) (ACUTE ONLY): 28 min  Charges:  $Gait Training: 8-22 mins $Therapeutic Exercise: 8-22 mins                     {Boone Gear  PTA Acute  Rehabilitation Services Pager      [phone] Office      [phone]

## 2019-02-14 NOTE — Progress Notes (Signed)
Discharge Plan of care Ortho Bundle Case Management Note: He plans to discharge to home with family and go to [place]. Rolling walker has been ordered for him from [facility] No other needs identified.

## 2019-02-14 NOTE — Progress Notes (Signed)
Patient has 3 in 1 equipment, PT has worked with patient. Patient was given discharge instructions and questions were answered. Patient was taken to main entrance via wheelchair.

## 2019-02-16 DIAGNOSIS — R2689 Other abnormalities of gait and mobility: Secondary | ICD-10-CM | POA: Diagnosis not present

## 2019-02-16 DIAGNOSIS — Z96642 Presence of left artificial hip joint: Secondary | ICD-10-CM | POA: Diagnosis not present

## 2019-02-16 DIAGNOSIS — M25552 Pain in left hip: Secondary | ICD-10-CM | POA: Diagnosis not present

## 2019-02-16 DIAGNOSIS — M62552 Muscle wasting and atrophy, not elsewhere classified, left thigh: Secondary | ICD-10-CM | POA: Diagnosis not present

## 2019-02-19 ENCOUNTER — Ambulatory Visit: Payer: Medicare Other

## 2019-02-20 DIAGNOSIS — M62552 Muscle wasting and atrophy, not elsewhere classified, left thigh: Secondary | ICD-10-CM | POA: Diagnosis not present

## 2019-02-20 DIAGNOSIS — R2689 Other abnormalities of gait and mobility: Secondary | ICD-10-CM | POA: Diagnosis not present

## 2019-02-20 DIAGNOSIS — M25552 Pain in left hip: Secondary | ICD-10-CM | POA: Diagnosis not present

## 2019-02-20 DIAGNOSIS — Z96642 Presence of left artificial hip joint: Secondary | ICD-10-CM | POA: Diagnosis not present

## 2019-02-21 DIAGNOSIS — K582 Mixed irritable bowel syndrome: Secondary | ICD-10-CM | POA: Diagnosis not present

## 2019-02-22 DIAGNOSIS — M25552 Pain in left hip: Secondary | ICD-10-CM | POA: Diagnosis not present

## 2019-02-22 DIAGNOSIS — M62552 Muscle wasting and atrophy, not elsewhere classified, left thigh: Secondary | ICD-10-CM | POA: Diagnosis not present

## 2019-02-22 DIAGNOSIS — Z96642 Presence of left artificial hip joint: Secondary | ICD-10-CM | POA: Diagnosis not present

## 2019-02-22 DIAGNOSIS — R2689 Other abnormalities of gait and mobility: Secondary | ICD-10-CM | POA: Diagnosis not present

## 2019-02-23 DIAGNOSIS — J4550 Severe persistent asthma, uncomplicated: Secondary | ICD-10-CM | POA: Diagnosis not present

## 2019-02-23 DIAGNOSIS — M1612 Unilateral primary osteoarthritis, left hip: Secondary | ICD-10-CM | POA: Diagnosis not present

## 2019-02-26 ENCOUNTER — Ambulatory Visit: Payer: Self-pay

## 2019-02-26 ENCOUNTER — Ambulatory Visit (INDEPENDENT_AMBULATORY_CARE_PROVIDER_SITE_OTHER): Payer: Medicare Other

## 2019-02-26 DIAGNOSIS — J4551 Severe persistent asthma with (acute) exacerbation: Secondary | ICD-10-CM

## 2019-02-26 DIAGNOSIS — J4550 Severe persistent asthma, uncomplicated: Secondary | ICD-10-CM | POA: Diagnosis not present

## 2019-02-27 DIAGNOSIS — M25552 Pain in left hip: Secondary | ICD-10-CM | POA: Diagnosis not present

## 2019-02-27 DIAGNOSIS — M62552 Muscle wasting and atrophy, not elsewhere classified, left thigh: Secondary | ICD-10-CM | POA: Diagnosis not present

## 2019-02-27 DIAGNOSIS — Z96642 Presence of left artificial hip joint: Secondary | ICD-10-CM | POA: Diagnosis not present

## 2019-02-27 DIAGNOSIS — R2689 Other abnormalities of gait and mobility: Secondary | ICD-10-CM | POA: Diagnosis not present

## 2019-03-02 DIAGNOSIS — M25552 Pain in left hip: Secondary | ICD-10-CM | POA: Diagnosis not present

## 2019-03-02 DIAGNOSIS — R2689 Other abnormalities of gait and mobility: Secondary | ICD-10-CM | POA: Diagnosis not present

## 2019-03-02 DIAGNOSIS — Z96642 Presence of left artificial hip joint: Secondary | ICD-10-CM | POA: Diagnosis not present

## 2019-03-02 DIAGNOSIS — M62552 Muscle wasting and atrophy, not elsewhere classified, left thigh: Secondary | ICD-10-CM | POA: Diagnosis not present

## 2019-03-06 DIAGNOSIS — M25552 Pain in left hip: Secondary | ICD-10-CM | POA: Diagnosis not present

## 2019-03-06 DIAGNOSIS — M62552 Muscle wasting and atrophy, not elsewhere classified, left thigh: Secondary | ICD-10-CM | POA: Diagnosis not present

## 2019-03-06 DIAGNOSIS — R2689 Other abnormalities of gait and mobility: Secondary | ICD-10-CM | POA: Diagnosis not present

## 2019-03-06 DIAGNOSIS — Z96642 Presence of left artificial hip joint: Secondary | ICD-10-CM | POA: Diagnosis not present

## 2019-03-09 DIAGNOSIS — M25552 Pain in left hip: Secondary | ICD-10-CM | POA: Diagnosis not present

## 2019-03-09 DIAGNOSIS — R2689 Other abnormalities of gait and mobility: Secondary | ICD-10-CM | POA: Diagnosis not present

## 2019-03-09 DIAGNOSIS — Z96642 Presence of left artificial hip joint: Secondary | ICD-10-CM | POA: Diagnosis not present

## 2019-03-09 DIAGNOSIS — M62552 Muscle wasting and atrophy, not elsewhere classified, left thigh: Secondary | ICD-10-CM | POA: Diagnosis not present

## 2019-03-13 DIAGNOSIS — M62552 Muscle wasting and atrophy, not elsewhere classified, left thigh: Secondary | ICD-10-CM | POA: Diagnosis not present

## 2019-03-13 DIAGNOSIS — R2689 Other abnormalities of gait and mobility: Secondary | ICD-10-CM | POA: Diagnosis not present

## 2019-03-13 DIAGNOSIS — Z96642 Presence of left artificial hip joint: Secondary | ICD-10-CM | POA: Diagnosis not present

## 2019-03-13 DIAGNOSIS — M25552 Pain in left hip: Secondary | ICD-10-CM | POA: Diagnosis not present

## 2019-03-15 DIAGNOSIS — R2689 Other abnormalities of gait and mobility: Secondary | ICD-10-CM | POA: Diagnosis not present

## 2019-03-15 DIAGNOSIS — M25552 Pain in left hip: Secondary | ICD-10-CM | POA: Diagnosis not present

## 2019-03-15 DIAGNOSIS — M62552 Muscle wasting and atrophy, not elsewhere classified, left thigh: Secondary | ICD-10-CM | POA: Diagnosis not present

## 2019-03-15 DIAGNOSIS — Z96642 Presence of left artificial hip joint: Secondary | ICD-10-CM | POA: Diagnosis not present

## 2019-03-19 ENCOUNTER — Other Ambulatory Visit: Payer: Self-pay

## 2019-03-19 ENCOUNTER — Ambulatory Visit (INDEPENDENT_AMBULATORY_CARE_PROVIDER_SITE_OTHER): Payer: Medicare Other | Admitting: Allergy and Immunology

## 2019-03-19 ENCOUNTER — Encounter: Payer: Self-pay | Admitting: Allergy and Immunology

## 2019-03-19 VITALS — BP 182/92 | HR 76 | Temp 98.2°F | Resp 14

## 2019-03-19 DIAGNOSIS — D721 Eosinophilia, unspecified: Secondary | ICD-10-CM

## 2019-03-19 DIAGNOSIS — J3089 Other allergic rhinitis: Secondary | ICD-10-CM | POA: Diagnosis not present

## 2019-03-19 DIAGNOSIS — J4551 Severe persistent asthma with (acute) exacerbation: Secondary | ICD-10-CM

## 2019-03-19 DIAGNOSIS — K219 Gastro-esophageal reflux disease without esophagitis: Secondary | ICD-10-CM | POA: Diagnosis not present

## 2019-03-19 DIAGNOSIS — Z8709 Personal history of other diseases of the respiratory system: Secondary | ICD-10-CM | POA: Diagnosis not present

## 2019-03-19 DIAGNOSIS — J479 Bronchiectasis, uncomplicated: Secondary | ICD-10-CM

## 2019-03-19 MED ORDER — METHYLPREDNISOLONE ACETATE 80 MG/ML IJ SUSP
80.0000 mg | Freq: Once | INTRAMUSCULAR | Status: AC
  Administered 2019-03-19: 80 mg via INTRAMUSCULAR

## 2019-03-19 MED ORDER — EPINEPHRINE 0.3 MG/0.3ML IJ SOAJ: INTRAMUSCULAR | 3 refills | Status: DC

## 2019-03-19 NOTE — Progress Notes (Signed)
[place]   Follow-up Note  Referring Provider: Nicoletta Dress, MD Primary Provider: Nicoletta Dress, MD Date of Office Visit: 03/19/2019  Subjective:   Kyle [name] (DOB: 28-Sep-1950) is a 68 y.o. male who returns to the [facility] [place] on 03/19/2019 in re-evaluation of the following:  HPI: Kyle [name] returns to this clinic in evaluation of his asthma treated with benralizumab, history of chronic sinusitis, bronchiectasis, allergic rhinitis, and reflux induced respiratory disease.  His last visit to this clinic was 20 December 2018 at which point time we started him on benralizumab.  He was doing wonderful and had very little issues with his airway but unfortunately about 5 weeks ago he underwent left hip replacement which went quite well but ever since then he has been dyspneic on exertion.  He has not really had any cough or chest pain or sputum production or significant upper airway symptoms or reflux symptoms.  He did increase his Symbicort to twice a day and added in Qvar for the past 10 days.  It should be noted that he was using Eliquis since surgery which he just discontinued 5 days ago.  Allergies as of 03/19/2019      Reactions   Augmentin [amoxicillin-pot Clavulanate]    Terrible heartburn Did it involve swelling of the face/tongue/throat, SOB, or low BP? No Did it involve sudden or severe rash/hives, skin peeling, or any reaction on the inside of your mouth or nose? No Did you need to seek medical attention at a hospital or doctor's office? No When did it last happen?2 years If all above answers are "NO", may proceed with cephalosporin use.   Levaquin [levofloxacin In D5w]    Terrible heartburn      Medication List    albuterol (2.5 MG/3ML) 0.083% nebulizer solution Commonly known as: PROVENTIL Take 2.5 mg by nebulization every 4 (four) hours as needed for wheezing or shortness of breath.    albuterol 108 (90 Base) MCG/ACT inhaler Commonly known as: ProAir HFA Inhale two puffs every 4-6 hours if needed for cough or wheeze.   ALPRAZolam 0.5 MG tablet Commonly known as: XANAX Take 1 (one) Tablet three times daily, as needed   beclomethasone 80 MCG/ACT inhaler Commonly known as: Qvar RediHaler Inhale two doses twice daily during asthma flare-up.  Rinse, gargle, and spit after use.   budesonide-formoterol 160-4.5 MCG/ACT inhaler Commonly known as: Symbicort USE 2 INHALATIONS ORALLY TWICE DAILY TO PREVENT COUGH OR WHEEZE. RINSE, GARGLE AND SPIT AFTER USE   EPINEPHrine 0.3 mg/0.3 mL Soaj injection Commonly known as: EPI-PEN Use for life threatening allergic reactions   esomeprazole 40 MG capsule Commonly known as: NexIUM Take 1 capsule (40 mg total) by mouth daily. Can increase to twice a day during flare-up.   GARLIC PO Take 1 tablet by mouth daily.   GINGER PO Take 1 tablet by mouth daily.   loratadine 10 MG tablet Commonly known as: CLARITIN Take 10 mg by mouth daily as needed for allergies.   LUBRICATING EYE DROPS OP Place 1 drop into both eyes daily as needed (irritation).   Melatonin 5 MG Caps Take 5 mg by mouth at bedtime as needed (sleep).   mometasone 50 MCG/ACT nasal spray Commonly known as: NASONEX Use 1-2 sprays in each nostril once daily.   MULTIVITAMIN PO Take 1 tablet by mouth daily.   Turmeric Curcumin Caps Take 1 capsule by mouth daily.   vitamin C 1000  MG tablet Take 1,000 mg by mouth daily.       Past Medical History:  Diagnosis Date  . Asthma   . Cluster headache   . Complication of anesthesia 1990   Difficult to arouse and bronchospasm  . Prostate cancer ([place]) 2020  . Reflux   . Sinusitis     Past Surgical History:  Procedure Laterality Date  . bone spur removed    . KNEE ARTHROPLASTY     x 2  . SINOSCOPY     6 SURGERIES  . tear duct replacement    . TOTAL HIP ARTHROPLASTY Left 02/13/2019   Procedure: TOTAL HIP  ARTHROPLASTY ANTERIOR APPROACH;  Surgeon: Melrose Nakayama, MD;  Location: [facility];  Service: Orthopedics;  Laterality: Left;    Review of systems negative except as noted in HPI / PMHx or noted below:  Review of Systems  Constitutional: Negative.   HENT: Negative.   Eyes: Negative.   Respiratory: Negative.   Cardiovascular: Negative.   Gastrointestinal: Negative.   Genitourinary: Negative.   Musculoskeletal: Negative.   Skin: Negative.   Neurological: Negative.   Endo/Heme/Allergies: Negative.   Psychiatric/Behavioral: Negative.      Objective:   Vitals:   03/19/19 1134  BP: (!) 182/92  Pulse: 76  Resp: 14  Temp: 98.2 F (36.8 C)  SpO2: 98%          Physical Exam Constitutional:      Appearance: He is not diaphoretic.  HENT:     Head: Normocephalic.     Right Ear: Tympanic membrane, ear canal and external ear normal.     Left Ear: Tympanic membrane, ear canal and external ear normal.     Nose: Nose normal. No mucosal edema or rhinorrhea.     Mouth/Throat:     Pharynx: Uvula midline. No oropharyngeal exudate.  Eyes:     Conjunctiva/sclera: Conjunctivae normal.  Neck:     Thyroid: No thyromegaly.     Trachea: Trachea normal. No tracheal tenderness or tracheal deviation.  Cardiovascular:     Rate and Rhythm: Normal rate and regular rhythm.     Heart sounds: Normal heart sounds, S1 normal and S2 normal. No murmur.  Pulmonary:     Effort: No respiratory distress.     Breath sounds: Normal breath sounds. No stridor. No wheezing or rales.  Lymphadenopathy:     Head:     Right side of head: No tonsillar adenopathy.     Left side of head: No tonsillar adenopathy.     Cervical: No cervical adenopathy.  Skin:    Findings: No erythema or rash.     Nails: There is no clubbing.   Neurological:     Mental Status: He is alert.     Diagnostics:    Spirometry was performed and demonstrated an FEV1 of 2.66 at 74 % of predicted.  Assessment and Plan:   1.  Asthma, not well controlled, severe persistent, with acute exacerbation   2. Bronchiectasis without complication ([place])   3. Eosinophilia   4. Other allergic rhinitis   5. History of chronic sinusitis   6. LPRD (laryngopharyngeal reflux disease)     1. Continue Symbicort 160- 2 inhalations twice a day   2. Continue Nasonex 1-2 sprays each nostril one time per day    3. Continue Nexium 40 mg one tablet once a day.    4. Continue Benralizumab injections  5. Depo-Medrol 80 IM delivered in clinic today  6. If needed:  A. Ventolin HFA or similar 2 inhalations every 4-6 hours  B. Daily antihistamine   C. Mucinex DM  7. "Action Plan" for flare up:   A. Add Qvar 80 - 2 inhalations twice a day to Symbicort    B. Increase Nexium to 2 times per day  8. Return to clinic in 6 months or earlier if problem   9. Obtain fall flu (and COVID) vaccine  I will assume that Kyle [name] has developed some problems with inflammation of his airway and treat him with a systemic steroid as noted above.  Overall he was really doing much better while using benralizumab and we will continue him on these injections.  He will keep in contact with me noting his response as he moves forward with this plan.  Of course he will obtain a flu vaccine this fall.  Allena Katz, MD Allergy / Immunology [place]

## 2019-03-19 NOTE — Patient Instructions (Addendum)
  1. Continue Symbicort 160- 2 inhalations twice a day   2. Continue Nasonex 1-2 sprays each nostril one time per day    3. Continue Nexium 40 mg one tablet once a day.    4. Continue Benralizumab injections  5.  Depo-Medrol 80 IM delivered in clinic today  6. If needed:   A. Ventolin HFA or similar 2 inhalations every 4-6 hours  B. Daily antihistamine   C. Mucinex DM  7. "Action Plan" for flare up:   A. Add Qvar 80 - 2 inhalations twice a day to Symbicort    B. Increase Nexium to 2 times per day  8. Return to clinic in 6 months or earlier if problem   9. Obtain fall flu (and COVID) vaccine

## 2019-03-20 ENCOUNTER — Encounter: Payer: Self-pay | Admitting: Allergy and Immunology

## 2019-03-21 DIAGNOSIS — M25552 Pain in left hip: Secondary | ICD-10-CM | POA: Diagnosis not present

## 2019-03-21 DIAGNOSIS — Z96642 Presence of left artificial hip joint: Secondary | ICD-10-CM | POA: Diagnosis not present

## 2019-03-21 DIAGNOSIS — M62552 Muscle wasting and atrophy, not elsewhere classified, left thigh: Secondary | ICD-10-CM | POA: Diagnosis not present

## 2019-03-21 DIAGNOSIS — R2689 Other abnormalities of gait and mobility: Secondary | ICD-10-CM | POA: Diagnosis not present

## 2019-03-23 DIAGNOSIS — Z96642 Presence of left artificial hip joint: Secondary | ICD-10-CM | POA: Diagnosis not present

## 2019-03-23 DIAGNOSIS — R2689 Other abnormalities of gait and mobility: Secondary | ICD-10-CM | POA: Diagnosis not present

## 2019-03-23 DIAGNOSIS — M62552 Muscle wasting and atrophy, not elsewhere classified, left thigh: Secondary | ICD-10-CM | POA: Diagnosis not present

## 2019-03-23 DIAGNOSIS — M25552 Pain in left hip: Secondary | ICD-10-CM | POA: Diagnosis not present

## 2019-03-26 ENCOUNTER — Ambulatory Visit: Payer: Self-pay

## 2019-03-27 DIAGNOSIS — Z96642 Presence of left artificial hip joint: Secondary | ICD-10-CM | POA: Diagnosis not present

## 2019-03-27 DIAGNOSIS — M25552 Pain in left hip: Secondary | ICD-10-CM | POA: Diagnosis not present

## 2019-03-27 DIAGNOSIS — M62552 Muscle wasting and atrophy, not elsewhere classified, left thigh: Secondary | ICD-10-CM | POA: Diagnosis not present

## 2019-03-27 DIAGNOSIS — R2689 Other abnormalities of gait and mobility: Secondary | ICD-10-CM | POA: Diagnosis not present

## 2019-03-28 ENCOUNTER — Telehealth: Payer: Self-pay | Admitting: Allergy and Immunology

## 2019-03-28 MED ORDER — FLUCONAZOLE 150 MG PO TABS: ORAL_TABLET | ORAL | 0 refills | Status: DC

## 2019-03-28 NOTE — Telephone Encounter (Signed)
Prescription sent in to requested pharmacy. Patient aware and understood.

## 2019-03-28 NOTE — Telephone Encounter (Signed)
Kyle [name] called in and states he has massive thrush.  He states he is swishing after inhaler use  But still has thrush and wants to know if something can be called in to [facility].  Please advise.

## 2019-03-28 NOTE — Telephone Encounter (Signed)
Please inform patient that we will send in Diflucan 150 mg tablet-take today and repeat in 1 week.

## 2019-03-29 DIAGNOSIS — M25552 Pain in left hip: Secondary | ICD-10-CM | POA: Diagnosis not present

## 2019-03-29 DIAGNOSIS — M62552 Muscle wasting and atrophy, not elsewhere classified, left thigh: Secondary | ICD-10-CM | POA: Diagnosis not present

## 2019-03-29 DIAGNOSIS — R2689 Other abnormalities of gait and mobility: Secondary | ICD-10-CM | POA: Diagnosis not present

## 2019-03-29 DIAGNOSIS — Z96642 Presence of left artificial hip joint: Secondary | ICD-10-CM | POA: Diagnosis not present

## 2019-04-02 DIAGNOSIS — M25561 Pain in right knee: Secondary | ICD-10-CM | POA: Diagnosis not present

## 2019-04-04 DIAGNOSIS — Z96642 Presence of left artificial hip joint: Secondary | ICD-10-CM | POA: Diagnosis not present

## 2019-04-04 DIAGNOSIS — R2689 Other abnormalities of gait and mobility: Secondary | ICD-10-CM | POA: Diagnosis not present

## 2019-04-04 DIAGNOSIS — M62552 Muscle wasting and atrophy, not elsewhere classified, left thigh: Secondary | ICD-10-CM | POA: Diagnosis not present

## 2019-04-04 DIAGNOSIS — M25552 Pain in left hip: Secondary | ICD-10-CM | POA: Diagnosis not present

## 2019-04-11 DIAGNOSIS — Z96642 Presence of left artificial hip joint: Secondary | ICD-10-CM | POA: Diagnosis not present

## 2019-04-11 DIAGNOSIS — R2689 Other abnormalities of gait and mobility: Secondary | ICD-10-CM | POA: Diagnosis not present

## 2019-04-11 DIAGNOSIS — M62552 Muscle wasting and atrophy, not elsewhere classified, left thigh: Secondary | ICD-10-CM | POA: Diagnosis not present

## 2019-04-11 DIAGNOSIS — M25552 Pain in left hip: Secondary | ICD-10-CM | POA: Diagnosis not present

## 2019-04-16 DIAGNOSIS — C61 Malignant neoplasm of prostate: Secondary | ICD-10-CM | POA: Diagnosis not present

## 2019-04-16 DIAGNOSIS — N3289 Other specified disorders of bladder: Secondary | ICD-10-CM | POA: Diagnosis not present

## 2019-04-17 DIAGNOSIS — Z96642 Presence of left artificial hip joint: Secondary | ICD-10-CM | POA: Diagnosis not present

## 2019-04-17 DIAGNOSIS — R2689 Other abnormalities of gait and mobility: Secondary | ICD-10-CM | POA: Diagnosis not present

## 2019-04-17 DIAGNOSIS — M25552 Pain in left hip: Secondary | ICD-10-CM | POA: Diagnosis not present

## 2019-04-17 DIAGNOSIS — M62552 Muscle wasting and atrophy, not elsewhere classified, left thigh: Secondary | ICD-10-CM | POA: Diagnosis not present

## 2019-04-20 DIAGNOSIS — J4550 Severe persistent asthma, uncomplicated: Secondary | ICD-10-CM

## 2019-04-23 ENCOUNTER — Ambulatory Visit (INDEPENDENT_AMBULATORY_CARE_PROVIDER_SITE_OTHER): Payer: Medicare Other | Admitting: *Deleted

## 2019-04-23 ENCOUNTER — Other Ambulatory Visit: Payer: Self-pay

## 2019-04-23 DIAGNOSIS — J4550 Severe persistent asthma, uncomplicated: Secondary | ICD-10-CM | POA: Diagnosis not present

## 2019-04-23 MED ORDER — BENRALIZUMAB 30 MG/ML ~~LOC~~ SOSY
30.0000 mg | PREFILLED_SYRINGE | SUBCUTANEOUS | Status: AC
  Administered 2019-04-23 – 2024-09-11 (×36): 30 mg via SUBCUTANEOUS

## 2019-04-26 DIAGNOSIS — M62552 Muscle wasting and atrophy, not elsewhere classified, left thigh: Secondary | ICD-10-CM | POA: Diagnosis not present

## 2019-04-26 DIAGNOSIS — M25552 Pain in left hip: Secondary | ICD-10-CM | POA: Diagnosis not present

## 2019-04-26 DIAGNOSIS — Z96642 Presence of left artificial hip joint: Secondary | ICD-10-CM | POA: Diagnosis not present

## 2019-04-26 DIAGNOSIS — R2689 Other abnormalities of gait and mobility: Secondary | ICD-10-CM | POA: Diagnosis not present

## 2019-05-07 ENCOUNTER — Other Ambulatory Visit: Payer: Self-pay | Admitting: Allergy and Immunology

## 2019-05-14 DIAGNOSIS — M1612 Unilateral primary osteoarthritis, left hip: Secondary | ICD-10-CM | POA: Diagnosis not present

## 2019-05-14 DIAGNOSIS — Z96642 Presence of left artificial hip joint: Secondary | ICD-10-CM | POA: Diagnosis not present

## 2019-05-20 ENCOUNTER — Other Ambulatory Visit: Payer: Self-pay | Admitting: Allergy and Immunology

## 2019-06-08 DIAGNOSIS — L570 Actinic keratosis: Secondary | ICD-10-CM | POA: Diagnosis not present

## 2019-06-08 DIAGNOSIS — L578 Other skin changes due to chronic exposure to nonionizing radiation: Secondary | ICD-10-CM | POA: Diagnosis not present

## 2019-06-08 DIAGNOSIS — L821 Other seborrheic keratosis: Secondary | ICD-10-CM | POA: Diagnosis not present

## 2019-06-18 ENCOUNTER — Ambulatory Visit: Payer: Medicare Other | Admitting: Allergy and Immunology

## 2019-06-18 DIAGNOSIS — J4550 Severe persistent asthma, uncomplicated: Secondary | ICD-10-CM | POA: Diagnosis not present

## 2019-06-19 ENCOUNTER — Ambulatory Visit (INDEPENDENT_AMBULATORY_CARE_PROVIDER_SITE_OTHER): Payer: Medicare Other | Admitting: *Deleted

## 2019-06-19 ENCOUNTER — Other Ambulatory Visit: Payer: Self-pay

## 2019-06-19 DIAGNOSIS — J4550 Severe persistent asthma, uncomplicated: Secondary | ICD-10-CM

## 2019-07-02 ENCOUNTER — Other Ambulatory Visit: Payer: Self-pay | Admitting: Allergy and Immunology

## 2019-07-03 DIAGNOSIS — Z23 Encounter for immunization: Secondary | ICD-10-CM | POA: Diagnosis not present

## 2019-07-17 DIAGNOSIS — R1030 Lower abdominal pain, unspecified: Secondary | ICD-10-CM | POA: Diagnosis not present

## 2019-07-17 DIAGNOSIS — N3289 Other specified disorders of bladder: Secondary | ICD-10-CM | POA: Diagnosis not present

## 2019-07-17 DIAGNOSIS — C61 Malignant neoplasm of prostate: Secondary | ICD-10-CM | POA: Diagnosis not present

## 2019-07-24 DIAGNOSIS — L821 Other seborrheic keratosis: Secondary | ICD-10-CM | POA: Diagnosis not present

## 2019-07-24 DIAGNOSIS — L820 Inflamed seborrheic keratosis: Secondary | ICD-10-CM | POA: Diagnosis not present

## 2019-07-24 DIAGNOSIS — L578 Other skin changes due to chronic exposure to nonionizing radiation: Secondary | ICD-10-CM | POA: Diagnosis not present

## 2019-07-24 DIAGNOSIS — D485 Neoplasm of uncertain behavior of skin: Secondary | ICD-10-CM | POA: Diagnosis not present

## 2019-08-13 DIAGNOSIS — H524 Presbyopia: Secondary | ICD-10-CM | POA: Diagnosis not present

## 2019-08-13 DIAGNOSIS — J4550 Severe persistent asthma, uncomplicated: Secondary | ICD-10-CM | POA: Diagnosis not present

## 2019-08-13 DIAGNOSIS — H25813 Combined forms of age-related cataract, bilateral: Secondary | ICD-10-CM | POA: Diagnosis not present

## 2019-08-14 ENCOUNTER — Other Ambulatory Visit: Payer: Self-pay

## 2019-08-14 ENCOUNTER — Ambulatory Visit (INDEPENDENT_AMBULATORY_CARE_PROVIDER_SITE_OTHER): Payer: Medicare Other | Admitting: *Deleted

## 2019-08-14 DIAGNOSIS — J4550 Severe persistent asthma, uncomplicated: Secondary | ICD-10-CM | POA: Diagnosis not present

## 2019-09-12 ENCOUNTER — Other Ambulatory Visit: Payer: Self-pay | Admitting: Allergy and Immunology

## 2019-10-01 DIAGNOSIS — Z96642 Presence of left artificial hip joint: Secondary | ICD-10-CM | POA: Diagnosis not present

## 2019-10-01 DIAGNOSIS — M542 Cervicalgia: Secondary | ICD-10-CM | POA: Diagnosis not present

## 2019-10-01 DIAGNOSIS — M25521 Pain in right elbow: Secondary | ICD-10-CM | POA: Diagnosis not present

## 2019-10-08 DIAGNOSIS — J4550 Severe persistent asthma, uncomplicated: Secondary | ICD-10-CM

## 2019-10-09 ENCOUNTER — Ambulatory Visit (INDEPENDENT_AMBULATORY_CARE_PROVIDER_SITE_OTHER): Payer: Medicare Other | Admitting: *Deleted

## 2019-10-09 ENCOUNTER — Other Ambulatory Visit: Payer: Self-pay

## 2019-10-09 DIAGNOSIS — J4550 Severe persistent asthma, uncomplicated: Secondary | ICD-10-CM

## 2019-11-02 DIAGNOSIS — M25521 Pain in right elbow: Secondary | ICD-10-CM | POA: Diagnosis not present

## 2019-11-02 DIAGNOSIS — M79672 Pain in left foot: Secondary | ICD-10-CM | POA: Diagnosis not present

## 2019-11-05 DIAGNOSIS — L280 Lichen simplex chronicus: Secondary | ICD-10-CM | POA: Diagnosis not present

## 2019-11-05 DIAGNOSIS — L820 Inflamed seborrheic keratosis: Secondary | ICD-10-CM | POA: Diagnosis not present

## 2019-11-05 DIAGNOSIS — L821 Other seborrheic keratosis: Secondary | ICD-10-CM | POA: Diagnosis not present

## 2019-11-05 DIAGNOSIS — L578 Other skin changes due to chronic exposure to nonionizing radiation: Secondary | ICD-10-CM | POA: Diagnosis not present

## 2019-11-15 DIAGNOSIS — Z9181 History of falling: Secondary | ICD-10-CM | POA: Diagnosis not present

## 2019-11-15 DIAGNOSIS — Z1331 Encounter for screening for depression: Secondary | ICD-10-CM | POA: Diagnosis not present

## 2019-11-15 DIAGNOSIS — Z125 Encounter for screening for malignant neoplasm of prostate: Secondary | ICD-10-CM | POA: Diagnosis not present

## 2019-11-15 DIAGNOSIS — Z0000 Encounter for general adult medical examination without abnormal findings: Secondary | ICD-10-CM | POA: Diagnosis not present

## 2019-11-15 DIAGNOSIS — E785 Hyperlipidemia, unspecified: Secondary | ICD-10-CM | POA: Diagnosis not present

## 2019-11-15 DIAGNOSIS — Z136 Encounter for screening for cardiovascular disorders: Secondary | ICD-10-CM | POA: Diagnosis not present

## 2019-11-20 DIAGNOSIS — C61 Malignant neoplasm of prostate: Secondary | ICD-10-CM | POA: Diagnosis not present

## 2019-11-20 DIAGNOSIS — R351 Nocturia: Secondary | ICD-10-CM | POA: Diagnosis not present

## 2019-11-24 ENCOUNTER — Other Ambulatory Visit: Payer: Self-pay | Admitting: Allergy and Immunology

## 2019-11-28 DIAGNOSIS — M545 Low back pain: Secondary | ICD-10-CM | POA: Diagnosis not present

## 2019-12-04 ENCOUNTER — Ambulatory Visit: Payer: Self-pay

## 2019-12-04 DIAGNOSIS — J4550 Severe persistent asthma, uncomplicated: Secondary | ICD-10-CM

## 2019-12-04 DIAGNOSIS — J479 Bronchiectasis, uncomplicated: Secondary | ICD-10-CM | POA: Diagnosis not present

## 2019-12-04 DIAGNOSIS — J3089 Other allergic rhinitis: Secondary | ICD-10-CM | POA: Diagnosis not present

## 2019-12-04 DIAGNOSIS — I10 Essential (primary) hypertension: Secondary | ICD-10-CM | POA: Diagnosis not present

## 2019-12-04 DIAGNOSIS — K219 Gastro-esophageal reflux disease without esophagitis: Secondary | ICD-10-CM | POA: Diagnosis not present

## 2019-12-04 DIAGNOSIS — Z8709 Personal history of other diseases of the respiratory system: Secondary | ICD-10-CM | POA: Diagnosis not present

## 2019-12-05 ENCOUNTER — Ambulatory Visit: Payer: Medicare Other

## 2019-12-05 ENCOUNTER — Other Ambulatory Visit: Payer: Self-pay

## 2019-12-05 ENCOUNTER — Encounter: Payer: Self-pay | Admitting: Allergy and Immunology

## 2019-12-05 ENCOUNTER — Ambulatory Visit (INDEPENDENT_AMBULATORY_CARE_PROVIDER_SITE_OTHER): Payer: Medicare Other | Admitting: Allergy and Immunology

## 2019-12-05 VITALS — BP 160/90 | HR 90 | Resp 16 | Ht 72.21 in | Wt 220.8 lb

## 2019-12-05 DIAGNOSIS — J3089 Other allergic rhinitis: Secondary | ICD-10-CM

## 2019-12-05 DIAGNOSIS — K219 Gastro-esophageal reflux disease without esophagitis: Secondary | ICD-10-CM

## 2019-12-05 DIAGNOSIS — J4550 Severe persistent asthma, uncomplicated: Secondary | ICD-10-CM | POA: Diagnosis not present

## 2019-12-05 DIAGNOSIS — I10 Essential (primary) hypertension: Secondary | ICD-10-CM | POA: Diagnosis not present

## 2019-12-05 DIAGNOSIS — J479 Bronchiectasis, uncomplicated: Secondary | ICD-10-CM

## 2019-12-05 DIAGNOSIS — Z8709 Personal history of other diseases of the respiratory system: Secondary | ICD-10-CM

## 2019-12-05 NOTE — Progress Notes (Signed)
[place]   Follow-up Note  Referring Provider: Nicoletta Dress, MD Primary Provider: Nicoletta Dress, MD Date of Office Visit: 12/05/2019  Subjective:   Kyle [name] (DOB: 22-Sep-1950) is a 69 y.o. male who returns to the [facility] and [place] on 12/05/2019 in re-evaluation of the following:  HPI: Kyle [name] returns to this clinic in evaluation of asthma and a history of bronchiectasis and chronic sinusitis and allergic rhinitis and LPR.  His last visit to this clinic was 19 March 2019.  Overall he has really done well since his last visit.  He has not required a systemic steroid or an antibiotic to treat any type of airway issue.  He can exert himself as much as his musculoskeletal issue allows him to do so without any difficulty.  Rarely does he use a short acting bronchodilator.  His use of Symbicort has decreased dramatically since he has been using his benralizumab.  He utilizes Symbicort  intermittently usually around the time frame of 2 weeks prior to his upcoming Fasenra injection.    His nose has been doing well while using his Nasonex.    His reflux has been under good control while using his Nexium.  He did obtain 2 Moderna Covid vaccinations.  He developed an acute back injury at the beginning of March secondary to outdoor exercising which he is dealing with at this point in time.  Allergies as of 12/05/2019      Reactions   Augmentin [amoxicillin-pot Clavulanate]    Terrible heartburn Did it involve swelling of the face/tongue/throat, SOB, or low BP? No Did it involve sudden or severe rash/hives, skin peeling, or any reaction on the inside of your mouth or nose? No Did you need to seek medical attention at a hospital or doctor's office? No When did it last happen?2 years If all above answers are "NO", may proceed with cephalosporin use.   Levaquin [levofloxacin In D5w]    Terrible heartburn        Medication List      albuterol (2.5 MG/3ML) 0.083% nebulizer solution Commonly known as: PROVENTIL Take 2.5 mg by nebulization every 4 (four) hours as needed for wheezing or shortness of breath.   albuterol 108 (90 Base) MCG/ACT inhaler Commonly known as: ProAir HFA Inhale two puffs every 4-6 hours if needed for cough or wheeze.   ALPRAZolam 0.5 MG tablet Commonly known as: XANAX Take 1 (one) Tablet three times daily, as needed   budesonide-formoterol 160-4.5 MCG/ACT inhaler Commonly known as: SYMBICORT USE 2 INHALATIONS ORALLY   TWICE DAILY TO PREVENT     COUGH OR WHEEZE. RINSE     GARGLE & SPIT AFTER USE   EPINEPHrine 0.3 mg/0.3 mL Soaj injection Commonly known as: EPI-PEN Use as directed for life-threatening allergic reaction.   esomeprazole 40 MG capsule Commonly known as: NEXIUM TAKE 1 CAPSULE DAILY. CAN  INCREASE TO TWO TIMES A DAYDURING FLARE-UP   fluconazole 150 MG tablet Commonly known as: Diflucan Take 1 tablet by mouth today, then repeat dose in 1 week   GARLIC PO Take 1 tablet by mouth daily.   GINGER PO Take 1 tablet by mouth daily.   loratadine 10 MG tablet Commonly known as: CLARITIN Take 10 mg by mouth daily as needed for allergies.   LUBRICATING EYE DROPS OP Place 1 drop into both eyes daily as needed (irritation).   Melatonin 5 MG Caps Take 5 mg by mouth at  bedtime as needed (sleep).   mometasone 50 MCG/ACT nasal spray Commonly known as: NASONEX Use 1-2 sprays in each nostril once daily.   MULTIVITAMIN PO Take 1 tablet by mouth daily.   Qvar RediHaler 80 MCG/ACT inhaler Generic drug: beclomethasone Inhale 2 puffs into the lungs 2 (two) times daily as needed (shortness of breath).   Turmeric Curcumin Caps Take 1 capsule by mouth daily.   vitamin C 1000 MG tablet Take 1,000 mg by mouth daily.       Past Medical History:  Diagnosis Date  . Asthma   . Cluster headache   . Complication of anesthesia 1990   Difficult to arouse and  bronchospasm  . Prostate cancer ([place]) 2020  . Reflux   . Sinusitis     Past Surgical History:  Procedure Laterality Date  . bone spur removed    . KNEE ARTHROPLASTY     x 2  . SINOSCOPY     6 SURGERIES  . tear duct replacement    . TOTAL HIP ARTHROPLASTY Left 02/13/2019   Procedure: TOTAL HIP ARTHROPLASTY ANTERIOR APPROACH;  Surgeon: Melrose Nakayama, MD;  Location: [facility];  Service: Orthopedics;  Laterality: Left;    Review of systems negative except as noted in HPI / PMHx or noted below:  Review of Systems  Constitutional: Negative.   HENT: Negative.   Eyes: Negative.   Respiratory: Negative.   Cardiovascular: Negative.   Gastrointestinal: Negative.   Genitourinary: Negative.   Musculoskeletal: Negative.   Skin: Negative.   Neurological: Negative.   Endo/Heme/Allergies: Negative.   Psychiatric/Behavioral: Negative.      Objective:   Vitals:   12/05/19 1031 12/05/19 1059  BP: (!) 164/90 (!) 160/90  Pulse: 90   Resp: 16   SpO2: 94%    Height: 6' 0.21" (183.4 cm)  Weight: 220 lb 12.8 oz (100.2 kg)   Physical Exam Constitutional:      Appearance: He is not diaphoretic.  HENT:     Head: Normocephalic.     Right Ear: Tympanic membrane, ear canal and external ear normal.     Left Ear: Tympanic membrane, ear canal and external ear normal.     Nose: Nose normal. No mucosal edema or rhinorrhea.     Mouth/Throat:     Pharynx: Uvula midline. No oropharyngeal exudate.  Eyes:     Conjunctiva/sclera: Conjunctivae normal.  Neck:     Thyroid: No thyromegaly.     Trachea: Trachea normal. No tracheal tenderness or tracheal deviation.  Cardiovascular:     Rate and Rhythm: Normal rate and regular rhythm.     Heart sounds: Normal heart sounds, S1 normal and S2 normal. No murmur.  Pulmonary:     Effort: No respiratory distress.     Breath sounds: Normal breath sounds. No stridor. No wheezing or rales.  Lymphadenopathy:     Head:     Right side of head: No tonsillar  adenopathy.     Left side of head: No tonsillar adenopathy.     Cervical: No cervical adenopathy.  Skin:    Findings: No erythema or rash.     Nails: There is no clubbing.  Neurological:     Mental Status: He is alert.     Diagnostics:    Spirometry was performed and demonstrated an FEV1 of 3.03 at 85 % of predicted.  The patient had an Asthma Control Test with the following results: ACT Total Score: 22.    Assessment and Plan:   1. Severe  persistent asthma without complication   2. Bronchiectasis without complication (HCC)   3. Other allergic rhinitis   4. History of chronic sinusitis   5. LPRD (laryngopharyngeal reflux disease)   6. Essential hypertension     1. Continue Symbicort 160- 2 inhalations twice a day   2. Continue Nasonex 1-2 sprays each nostril one time per day    3. Continue Nexium 40 mg one tablet once a day.    4. Continue Benralizumab injections  5.  If needed:   A. Ventolin HFA or similar 2 inhalations every 4-6 hours  B. Daily antihistamine   C. Mucinex DM  6. "Action Plan" for flare up:   A. Add Qvar 80 - 2 inhalations twice a day to Symbicort    B. Increase Nexium to 2 times per day  7. BP=160/90: Modify caffeine consumption. Check blood pressure  8. Return to clinic in 6 months or earlier if problem   Kyle [name] appears to be doing relatively well regarding his airway issue and I have renewed medications directed against respiratory tract inflammation including the use of benralizumab and renewed his prescription for reflux.  He does have a high blood pressure today and we have informed him that he may need to have this addressed by his primary care doctor if indeed he establishes a trend of elevated blood pressure.  We also made the recommendation that he modify his caffeine consumption which may help this blood pressure issue.  If he does well I will see him back in his clinic in 6 months or earlier if there is a problem.  Allena Katz,  MD Allergy / Immunology [place]

## 2019-12-05 NOTE — Patient Instructions (Addendum)
  1. Continue Symbicort 160- 2 inhalations twice a day   2. Continue Nasonex 1-2 sprays each nostril one time per day    3. Continue Nexium 40 mg one tablet once a day.    4. Continue Benralizumab injections  5.  If needed:   A. Ventolin HFA or similar 2 inhalations every 4-6 hours  B. Daily antihistamine   C. Mucinex DM  6. "Action Plan" for flare up:   A. Add Qvar 80 - 2 inhalations twice a day to Symbicort    B. Increase Nexium to 2 times per day  7. BP=160/90: Modify caffeine consumption. Check blood pressure  8. Return to clinic in 6 months or earlier if problem

## 2019-12-06 ENCOUNTER — Encounter: Payer: Self-pay | Admitting: Allergy and Immunology

## 2019-12-10 DIAGNOSIS — M545 Low back pain: Secondary | ICD-10-CM | POA: Diagnosis not present

## 2019-12-17 ENCOUNTER — Other Ambulatory Visit: Payer: Self-pay | Admitting: Physical Medicine and Rehabilitation

## 2019-12-17 DIAGNOSIS — M545 Low back pain, unspecified: Secondary | ICD-10-CM

## 2019-12-20 ENCOUNTER — Other Ambulatory Visit: Payer: Self-pay | Admitting: Allergy and Immunology

## 2019-12-24 DIAGNOSIS — M545 Low back pain: Secondary | ICD-10-CM | POA: Diagnosis not present

## 2019-12-28 DIAGNOSIS — M47816 Spondylosis without myelopathy or radiculopathy, lumbar region: Secondary | ICD-10-CM | POA: Diagnosis not present

## 2020-01-14 ENCOUNTER — Other Ambulatory Visit: Payer: Federal, State, Local not specified - PPO

## 2020-01-15 DIAGNOSIS — M47816 Spondylosis without myelopathy or radiculopathy, lumbar region: Secondary | ICD-10-CM | POA: Diagnosis not present

## 2020-01-29 DIAGNOSIS — J4550 Severe persistent asthma, uncomplicated: Secondary | ICD-10-CM

## 2020-01-30 ENCOUNTER — Other Ambulatory Visit: Payer: Self-pay

## 2020-01-30 ENCOUNTER — Ambulatory Visit (INDEPENDENT_AMBULATORY_CARE_PROVIDER_SITE_OTHER): Payer: Medicare Other | Admitting: *Deleted

## 2020-01-30 DIAGNOSIS — J4550 Severe persistent asthma, uncomplicated: Secondary | ICD-10-CM

## 2020-02-06 DIAGNOSIS — J0111 Acute recurrent frontal sinusitis: Secondary | ICD-10-CM | POA: Diagnosis not present

## 2020-02-08 ENCOUNTER — Telehealth: Payer: Self-pay | Admitting: Allergy and Immunology

## 2020-02-08 MED ORDER — QVAR REDIHALER 80 MCG/ACT IN AERB: INHALATION_SPRAY | RESPIRATORY_TRACT | 1 refills | Status: DC

## 2020-02-08 NOTE — Telephone Encounter (Signed)
[name] would like Qvar sent to [facility] on [address] [place], [place] [address]  [facility]) [phone].

## 2020-02-08 NOTE — Telephone Encounter (Signed)
ERX sent to [facility] as requested.  °

## 2020-02-09 ENCOUNTER — Other Ambulatory Visit: Payer: Self-pay | Admitting: Allergy

## 2020-02-09 MED ORDER — ALBUTEROL SULFATE (2.5 MG/3ML) 0.083% IN NEBU: 2.5000 mg | INHALATION_SOLUTION | Freq: Four times a day (QID) | RESPIRATORY_TRACT | 0 refills | Status: DC | PRN

## 2020-02-09 MED ORDER — NEBULIZER DEVI: 1.0000 | 0 refills | Status: DC | PRN

## 2020-02-09 MED ORDER — ALBUTEROL SULFATE HFA 108 (90 BASE) MCG/ACT IN AERS: 2.0000 | INHALATION_SPRAY | RESPIRATORY_TRACT | 0 refills | Status: DC | PRN

## 2020-02-09 NOTE — Progress Notes (Signed)
Called by wife.  They are out of town and left his albuterol inhalers at home.  Reports last night he had an asthma attack and needs albuterol sent in to the pharmacy [place].   They are also buying a nebulizer and will need albuterol solution as well.  Sent prescriptions in including for neb device as per wife may be able to use flex acct with prescription.

## 2020-02-12 DIAGNOSIS — B9689 Other specified bacterial agents as the cause of diseases classified elsewhere: Secondary | ICD-10-CM | POA: Diagnosis not present

## 2020-02-12 DIAGNOSIS — J019 Acute sinusitis, unspecified: Secondary | ICD-10-CM | POA: Diagnosis not present

## 2020-02-15 DIAGNOSIS — M47816 Spondylosis without myelopathy or radiculopathy, lumbar region: Secondary | ICD-10-CM | POA: Diagnosis not present

## 2020-02-25 DIAGNOSIS — R002 Palpitations: Secondary | ICD-10-CM | POA: Diagnosis not present

## 2020-02-25 DIAGNOSIS — R0602 Shortness of breath: Secondary | ICD-10-CM | POA: Diagnosis not present

## 2020-02-25 DIAGNOSIS — J939 Pneumothorax, unspecified: Secondary | ICD-10-CM | POA: Diagnosis not present

## 2020-02-25 DIAGNOSIS — R000 Tachycardia, unspecified: Secondary | ICD-10-CM | POA: Diagnosis not present

## 2020-02-26 ENCOUNTER — Telehealth: Payer: Self-pay | Admitting: Cardiology

## 2020-02-26 NOTE — Telephone Encounter (Signed)
Wife of the patient called. The patient went [facility] ER yesterday. The ER doctor told him to follow up with Dr. Agustin Cree. The wife is concerned because the patient has seen Dr. Agustin Cree in the past but before he joined the [facility].  The wife wanted to know if he could be seen sooner than the appointment he is scheduled for 07/08

## 2020-02-26 NOTE — Telephone Encounter (Signed)
Please schedule him to see me within next week or so

## 2020-02-26 NOTE — Telephone Encounter (Signed)
Spoke to the patient just now and got him scheduled for 03/03/20. He verbalizes understanding and thanks me for the call back.    Encouraged patient to call back with any questions or concerns.

## 2020-02-27 ENCOUNTER — Other Ambulatory Visit: Payer: Self-pay

## 2020-02-28 DIAGNOSIS — R002 Palpitations: Secondary | ICD-10-CM | POA: Diagnosis not present

## 2020-02-28 DIAGNOSIS — I491 Atrial premature depolarization: Secondary | ICD-10-CM | POA: Diagnosis not present

## 2020-02-28 DIAGNOSIS — F410 Panic disorder [episodic paroxysmal anxiety] without agoraphobia: Secondary | ICD-10-CM | POA: Diagnosis not present

## 2020-02-28 DIAGNOSIS — Z139 Encounter for screening, unspecified: Secondary | ICD-10-CM | POA: Diagnosis not present

## 2020-03-03 ENCOUNTER — Encounter: Payer: Self-pay | Admitting: Cardiology

## 2020-03-03 ENCOUNTER — Other Ambulatory Visit: Payer: Self-pay

## 2020-03-03 ENCOUNTER — Ambulatory Visit (INDEPENDENT_AMBULATORY_CARE_PROVIDER_SITE_OTHER): Payer: Medicare Other | Admitting: Cardiology

## 2020-03-03 VITALS — BP 156/88 | HR 84 | Ht 72.0 in | Wt 211.0 lb

## 2020-03-03 DIAGNOSIS — R0609 Other forms of dyspnea: Secondary | ICD-10-CM

## 2020-03-03 DIAGNOSIS — R002 Palpitations: Secondary | ICD-10-CM

## 2020-03-03 DIAGNOSIS — I10 Essential (primary) hypertension: Secondary | ICD-10-CM | POA: Insufficient documentation

## 2020-03-03 DIAGNOSIS — R0600 Dyspnea, unspecified: Secondary | ICD-10-CM

## 2020-03-03 HISTORY — DX: Dyspnea, unspecified: R06.00

## 2020-03-03 HISTORY — DX: Other forms of dyspnea: R06.09

## 2020-03-03 HISTORY — DX: Palpitations: R00.2

## 2020-03-03 HISTORY — DX: Essential (primary) hypertension: I10

## 2020-03-03 NOTE — Patient Instructions (Signed)
Medication Instructions:  Your physician recommends that you continue on your current medications as directed. Please refer to the Current Medication list given to you today.  *If you need a refill on your cardiac medications before your next appointment, please call your pharmacy*   Lab Work: None. If you have labs (blood work) drawn today and your tests are completely normal, you will receive your results only by: . MyChart Message (if you have MyChart) OR . A paper copy in the mail If you have any lab test that is abnormal or we need to change your treatment, we will call you to review the results.   Testing/Procedures: Your physician has requested that you have an echocardiogram. Echocardiography is a painless test that uses sound waves to create images of your heart. It provides your doctor with information about the size and shape of your heart and how well your heart's chambers and valves are working. This procedure takes approximately one hour. There are no restrictions for this procedure.  A zio monitor was ordered today. It will remain on for 7 days. You will then return monitor and event diary in provided box. It takes 1-2 weeks for report to be downloaded and returned to us. We will call you with the results. If monitor falls off or has orange flashing light, please call Zio for further instructions.      Follow-Up: At [facility], you and your health needs are our priority.  As part of our continuing mission to provide you with exceptional heart care, we have created designated Provider Care Teams.  These Care Teams include your primary Cardiologist (physician) and Advanced Practice Providers (APPs -  Physician Assistants and Nurse Practitioners) who all work together to provide you with the care you need, when you need it.  We recommend signing up for the patient portal called "MyChart".  Sign up information is provided on this After Visit Summary.  MyChart is used to  connect with patients for Virtual Visits (Telemedicine).  Patients are able to view lab/test results, encounter notes, upcoming appointments, etc.  Non-urgent messages can be sent to your provider as well.   To learn more about what you can do with MyChart, go to [email or web address].    Your next appointment:   6 week(s)  The format for your next appointment:   In Person  Provider:   Robert Krasowski, MD   Other Instructions   Echocardiogram An echocardiogram is a procedure that uses painless sound waves (ultrasound) to produce an image of the heart. Images from an echocardiogram can provide important information about:  Signs of coronary artery disease (CAD).  Aneurysm detection. An aneurysm is a weak or damaged part of an artery wall that bulges out from the normal force of blood pumping through the body.  Heart size and shape. Changes in the size or shape of the heart can be associated with certain conditions, including heart failure, aneurysm, and CAD.  Heart muscle function.  Heart valve function.  Signs of a past heart attack.  Fluid buildup around the heart.  Thickening of the heart muscle.  A tumor or infectious growth around the heart valves. Tell a health care provider about:  Any allergies you have.  All medicines you are taking, including vitamins, herbs, eye drops, creams, and over-the-counter medicines.  Any blood disorders you have.  Any surgeries you have had.  Any medical conditions you have.  Whether you are pregnant or may be pregnant. What are the risks? Generally,   this is a safe procedure. However, problems may occur, including:  Allergic reaction to dye (contrast) that may be used during the procedure. What happens before the procedure? No specific preparation is needed. You may eat and drink normally. What happens during the procedure?   An IV tube may be inserted into one of your veins.  You may receive contrast through this  tube. A contrast is an injection that improves the quality of the pictures from your heart.  A gel will be applied to your chest.  A wand-like tool (transducer) will be moved over your chest. The gel will help to transmit the sound waves from the transducer.  The sound waves will harmlessly bounce off of your heart to allow the heart images to be captured in real-time motion. The images will be recorded on a computer. The procedure may vary among health care providers and hospitals. What happens after the procedure?  You may return to your normal, everyday life, including diet, activities, and medicines, unless your health care provider tells you not to do that. Summary  An echocardiogram is a procedure that uses painless sound waves (ultrasound) to produce an image of the heart.  Images from an echocardiogram can provide important information about the size and shape of your heart, heart muscle function, heart valve function, and fluid buildup around your heart.  You do not need to do anything to prepare before this procedure. You may eat and drink normally.  After the echocardiogram is completed, you may return to your normal, everyday life, unless your health care provider tells you not to do that. This information is not intended to replace advice given to you by your health care provider. Make sure you discuss any questions you have with your health care provider. Document Revised: 12/21/2018 Document Reviewed: 10/02/2016 Elsevier Patient Education  2020 [employer]   

## 2020-03-03 NOTE — Progress Notes (Signed)
Cardiology Consultation:    Date:  03/03/2020   ID:  Kyle [name], DOB [birth year], MRN [id number]  PCP:  Nicoletta Dress, MD  Cardiologist:  Jenne Campus, MD   Referring MD: Nicoletta Dress, MD   Chief Complaint  Patient presents with  . Follow-up  I have palpitations as well as are more about my high blood pressure  History of Present Illness:    Kyle [name] is a 69 y.o. male who is being seen today for the evaluation of palpitations and hypertension at the request of Nicoletta Dress, MD.  He has been always very worried in person.  For last few weeks to months he started experiencing some worsening of his anxiety about his health.  He ended up going to the emergency room just few days ago.  What happened is he did receive some steroids for his breathing that made his heart speeding up and beating forcefully on top of that he was restless at night he ended going to the emergency room.  Emergency room all evaluations are negative he was given some lorazepam.  He also noticed his blood pressure being high.  He is very worried and anxious about checking his blood pressure.  But is very concerned about this at the same time.  He is able to walk climb stairs with no difficulty.  He plays golf on a regular basis have no problem doing this.  Denies having atypical chest pain tightness squeezing pressure burning chest. He does not smoke There is no family history of premature coronary artery disease I have his cholesterol from [facility] this is from 2018 showing LDL of 119 and HDL of 65  Past Medical History:  Diagnosis Date  . Asthma   . Cluster headache   . Complication of anesthesia 1990   Difficult to arouse and bronchospasm  . Prostate cancer ([place]) 2020  . Reflux   . Sinusitis     Past Surgical History:  Procedure Laterality Date  . bone spur removed    . KNEE ARTHROPLASTY     x 2  . SINOSCOPY     6 SURGERIES  . tear duct replacement    . TOTAL HIP  ARTHROPLASTY Left 02/13/2019   Procedure: TOTAL HIP ARTHROPLASTY ANTERIOR APPROACH;  Surgeon: Melrose Nakayama, MD;  Location: [facility];  Service: Orthopedics;  Laterality: Left;    Current Medications: Current Meds  Medication Sig  . albuterol (PROAIR HFA) 108 (90 Base) MCG/ACT inhaler Inhale two puffs every 4-6 hours if needed for cough or wheeze. (Patient taking differently: Inhale 2 puffs into the lungs every 6 (six) hours as needed for wheezing or shortness of breath. )  . albuterol (PROVENTIL) (2.5 MG/3ML) 0.083% nebulizer solution Take 2.5 mg by nebulization every 4 (four) hours as needed for wheezing or shortness of breath.   Marland Kitchen albuterol (PROVENTIL) (2.5 MG/3ML) 0.083% nebulizer solution Take 3 mLs (2.5 mg total) by nebulization every 6 (six) hours as needed for wheezing or shortness of breath.  Marland Kitchen albuterol (VENTOLIN HFA) 108 (90 Base) MCG/ACT inhaler Inhale 2 puffs into the lungs every 4 (four) hours as needed for wheezing or shortness of breath.  . ALPRAZolam (XANAX) 0.5 MG tablet Take 1 (one) Tablet three times daily, as needed  . Ascorbic Acid (VITAMIN C) 1000 MG tablet Take 1,000 mg by mouth daily.  . beclomethasone (QVAR REDIHALER) 80 MCG/ACT inhaler Inhale two doses twice daily during flare-up as directed. Rinse, gargle, and spit after use.  Marland Kitchen  budesonide-formoterol (SYMBICORT) 160-4.5 MCG/ACT inhaler USE 2 INHALATIONS ORALLY TWICE DAILY TO PREVENT COUGH OR WHEEZE. RINSE, GARGLE AND SPIT AFTER USE  . Carboxymethylcellul-Glycerin (LUBRICATING EYE DROPS OP) Place 1 drop into both eyes daily as needed (irritation).  . clindamycin (CLEOCIN) 300 MG capsule Take 300 mg by mouth 3 (three) times daily.  . cyclobenzaprine (FLEXERIL) 5 MG tablet Take 5 mg by mouth at bedtime.  Marland Kitchen doxycycline (VIBRA-TABS) 100 MG tablet Take 100 mg by mouth 2 (two) times daily.  Marland Kitchen EPINEPHrine 0.3 mg/0.3 mL IJ SOAJ injection Use as directed for life-threatening allergic reaction.  Marland Kitchen esomeprazole (NEXIUM) 40 MG capsule  TAKE 1 CAPSULE DAILY. CAN  INCREASE TO TWO TIMES A DAYDURING FLARE-UP  . fluconazole (DIFLUCAN) 150 MG tablet Take 1 tablet by mouth today, then repeat dose in 1 week  . GARLIC PO Take 1 tablet by mouth daily.   . Ginger, Zingiber officinalis, (GINGER PO) Take 1 tablet by mouth daily.   . hydrOXYzine (VISTARIL) 25 MG capsule Take 1 capsule by mouth daily.  Marland Kitchen ketorolac (TORADOL) 60 MG/2ML SOLN injection INJECT 2CC SYRINGE INTO RIGHT GLUTE IM  . loratadine (CLARITIN) 10 MG tablet Take 10 mg by mouth daily as needed for allergies.  Marland Kitchen LORazepam (ATIVAN) 0.5 MG tablet Take 0.5 mg by mouth 2 (two) times daily as needed.  . Melatonin 5 MG CAPS Take 5 mg by mouth at bedtime as needed (sleep).  . Misc Natural Products (TURMERIC CURCUMIN) CAPS Take 1 capsule by mouth daily.  . mometasone (NASONEX) 50 MCG/ACT nasal spray Use 1-2 sprays in each nostril once daily. (Patient taking differently: Place 1 spray into the nose 2 (two) times a day. )  . Multiple Vitamins-Minerals (MULTIVITAMIN PO) Take 1 tablet by mouth daily.   . pseudoephedrine-guaifenesin (MUCINEX D) 60-600 MG 12 hr tablet Take 1 tablet by mouth every 12 (twelve) hours.  Marland Kitchen Respiratory Therapy Supplies (NEBULIZER) DEVI 1 Device by Does not apply route as needed (use nebulizer if having cough, wheeze, shortness of breath, difficulty breathing).  Marland Kitchen tiZANidine (ZANAFLEX) 2 MG tablet Take 2 mg by mouth 3 (three) times daily as needed.   Current Facility-Administered Medications for the 03/03/20 encounter (Office Visit) with Park Liter, MD  Medication  . Benralizumab SOSY 30 mg     Allergies:   Augmentin [amoxicillin-pot clavulanate] and Levaquin [levofloxacin in d5w]   Social History   Socioeconomic History  . Marital status: Married    Spouse name: Not on file  . Number of children: 2  . Years of education: Masters  . Highest education level: Not on file  Occupational History  . Occupation: Production assistant, radio    Comment:  Retired Forensic psychologist  Tobacco Use  . Smoking status: Never Smoker  . Smokeless tobacco: Never Used  Vaping Use  . Vaping Use: Never used  Substance and Sexual Activity  . Alcohol use: No  . Drug use: No  . Sexual activity: Not Currently  Other Topics Concern  . Not on file  Social History Narrative   Lives at home with his wife.   Right-handed.   2.5 - 3 cups coffee per day.   Social Determinants of Health   Financial Resource Strain:   . Difficulty of Paying Living Expenses:   Food Insecurity:   . Worried About Charity fundraiser in the Last Year:   . Arboriculturist in the Last Year:   Transportation Needs:   . Film/video editor (Medical):   Marland Kitchen  Lack of Transportation (Non-Medical):   Physical Activity:   . Days of Exercise per Week:   . Minutes of Exercise per Session:   Stress:   . Feeling of Stress :   Social Connections:   . Frequency of Communication with Friends and Family:   . Frequency of Social Gatherings with Friends and Family:   . Attends Religious Services:   . Active Member of Clubs or Organizations:   . Attends Archivist Meetings:   Marland Kitchen Marital Status:      Family History: The patient's family history includes Asthma in his father; Emphysema in his mother; Heart attack in his mother; Stroke in his father. ROS:   Please see the history of present illness.    All 14 point review of systems negative except as described per history of present illness.  EKGs/Labs/Other Studies Reviewed:    The following studies were reviewed today: Records from the emergency room reviewed EKG:  EKG is  ordered today.  The ekg ordered today demonstrates normal sinus rhythm, normal P interval, normal QS complex duration morphology, no ST segment changes  Recent Labs: No results found for requested labs within last 8760 hours.  Recent Lipid Panel    Component Value Date/Time   CHOL 195 07/11/2017 0900   TRIG 57 07/11/2017 0900   HDL 65 07/11/2017 0900    CHOLHDL 3.0 07/11/2017 0900   LDLCALC 119 (H) 07/11/2017 0900    Physical Exam:    VS:  BP (!) 156/88 (BP Location: Right Arm, Patient Position: Sitting, Cuff Size: Normal)   Pulse 84   Ht 6' (1.829 m)   Wt 211 lb (95.7 kg)   SpO2 98%   BMI 28.62 kg/m     Wt Readings from Last 3 Encounters:  03/03/20 211 lb (95.7 kg)  12/05/19 220 lb 12.8 oz (100.2 kg)  02/13/19 202 lb 1 oz (91.7 kg)     GEN:  Well nourished, well developed in no acute distress HEENT: Normal NECK: No JVD; No carotid bruits LYMPHATICS: No lymphadenopathy CARDIAC: RRR, no murmurs, no rubs, no gallops RESPIRATORY:  Clear to auscultation without rales, wheezing or rhonchi  ABDOMEN: Soft, non-tender, non-distended MUSCULOSKELETAL:  No edema; No deformity  SKIN: Warm and dry NEUROLOGIC:  Alert and oriented x 3 PSYCHIATRIC:  Normal affect   ASSESSMENT:    1. Palpitations   2. Dyspnea on exertion   3. Essential hypertension    PLAN:    In order of problems listed above:  1. Palpitations: I suspect this is combination of steroids that he used as well as some anxiety.  However, to make sure there is no significant arrhythmia I will ask him to have Zio patch for 1 week. 2. Essential hypertension, questionable diagnosis.  He is very anxious about checking his blood pressure.  I will ask him to have an echocardiogram done to make sure he does not have any significant left ventricle hypertrophy.  If he does then we need to start aggressively treating him for hypertension.  His EKG did not show any LVH. 3. Dyslipidemia I do have cholesterol from 2019.  We will make arrangements for his cholesterol to be checked. 4. We talked in length about anxiety as well as relaxation technique.  He need to find some way to help him relax because of his being so tense.  He does walk on a regular basis I encouraged him to continue that.   Medication Adjustments/Labs and Tests Ordered: Current medicines are reviewed  at length with  the patient today.  Concerns regarding medicines are outlined above.  Orders Placed This Encounter  Procedures  . LONG TERM MONITOR (3-14 DAYS)  . EKG 12-Lead  . ECHOCARDIOGRAM COMPLETE  . ECHOCARDIOGRAM COMPLETE   No orders of the defined types were placed in this encounter.   Signed, Park Liter, MD, [facility]. 03/03/2020 2:39 PM    [place]

## 2020-03-08 ENCOUNTER — Ambulatory Visit (INDEPENDENT_AMBULATORY_CARE_PROVIDER_SITE_OTHER): Payer: Medicare Other

## 2020-03-08 DIAGNOSIS — R002 Palpitations: Secondary | ICD-10-CM | POA: Diagnosis not present

## 2020-03-13 ENCOUNTER — Other Ambulatory Visit: Payer: Federal, State, Local not specified - PPO

## 2020-03-14 ENCOUNTER — Other Ambulatory Visit: Payer: Self-pay

## 2020-03-14 ENCOUNTER — Ambulatory Visit (INDEPENDENT_AMBULATORY_CARE_PROVIDER_SITE_OTHER): Payer: Medicare Other

## 2020-03-14 DIAGNOSIS — R002 Palpitations: Secondary | ICD-10-CM | POA: Diagnosis not present

## 2020-03-14 DIAGNOSIS — R0609 Other forms of dyspnea: Secondary | ICD-10-CM

## 2020-03-14 DIAGNOSIS — R0600 Dyspnea, unspecified: Secondary | ICD-10-CM | POA: Diagnosis not present

## 2020-03-14 NOTE — Progress Notes (Signed)
Complete echocardiogram performed.  Jimmy Teira Arcilla RDCS, RVT  

## 2020-03-20 ENCOUNTER — Ambulatory Visit: Payer: Federal, State, Local not specified - PPO | Admitting: Cardiology

## 2020-03-25 DIAGNOSIS — J4550 Severe persistent asthma, uncomplicated: Secondary | ICD-10-CM

## 2020-03-26 ENCOUNTER — Other Ambulatory Visit: Payer: Self-pay

## 2020-03-26 ENCOUNTER — Ambulatory Visit (INDEPENDENT_AMBULATORY_CARE_PROVIDER_SITE_OTHER): Payer: Medicare Other | Admitting: *Deleted

## 2020-03-26 DIAGNOSIS — J4550 Severe persistent asthma, uncomplicated: Secondary | ICD-10-CM | POA: Diagnosis not present

## 2020-03-28 ENCOUNTER — Other Ambulatory Visit (HOSPITAL_COMMUNITY): Payer: Self-pay | Admitting: Urology

## 2020-03-28 ENCOUNTER — Other Ambulatory Visit: Payer: Self-pay | Admitting: Urology

## 2020-03-28 DIAGNOSIS — C61 Malignant neoplasm of prostate: Secondary | ICD-10-CM

## 2020-03-31 DIAGNOSIS — R002 Palpitations: Secondary | ICD-10-CM | POA: Diagnosis not present

## 2020-04-01 NOTE — Addendum Note (Signed)
Addended by: Jerl Santos R on: 04/01/2020 10:49 AM   Modules accepted: Orders

## 2020-04-07 ENCOUNTER — Telehealth: Payer: Self-pay | Admitting: Cardiology

## 2020-04-07 DIAGNOSIS — H43811 Vitreous degeneration, right eye: Secondary | ICD-10-CM | POA: Diagnosis not present

## 2020-04-07 DIAGNOSIS — H2513 Age-related nuclear cataract, bilateral: Secondary | ICD-10-CM | POA: Diagnosis not present

## 2020-04-07 DIAGNOSIS — H43393 Other vitreous opacities, bilateral: Secondary | ICD-10-CM | POA: Diagnosis not present

## 2020-04-07 NOTE — Telephone Encounter (Signed)
Called patient back informed him of results.

## 2020-04-07 NOTE — Telephone Encounter (Signed)
Patient is returning a call to [facility] to discuss monitor. She states he spoke with her briefly and then the called was dropped. Please call.

## 2020-04-10 ENCOUNTER — Ambulatory Visit (HOSPITAL_COMMUNITY)
Admission: RE | Admit: 2020-04-10 | Discharge: 2020-04-10 | Disposition: A | Discharge status: Home / Self Care | Payer: Medicare Other | Source: Ambulatory Visit | Attending: Urology | Admitting: Urology

## 2020-04-10 ENCOUNTER — Other Ambulatory Visit: Payer: Self-pay

## 2020-04-10 DIAGNOSIS — C61 Malignant neoplasm of prostate: Secondary | ICD-10-CM | POA: Insufficient documentation

## 2020-04-10 MED ORDER — GADOBUTROL 1 MMOL/ML IV SOLN
10.0000 mL | Freq: Once | INTRAVENOUS | Status: AC | PRN
  Administered 2020-04-10: 10 mL via INTRAVENOUS

## 2020-04-16 ENCOUNTER — Ambulatory Visit (INDEPENDENT_AMBULATORY_CARE_PROVIDER_SITE_OTHER): Payer: Medicare Other | Admitting: Cardiology

## 2020-04-16 ENCOUNTER — Encounter: Payer: Self-pay | Admitting: Cardiology

## 2020-04-16 ENCOUNTER — Other Ambulatory Visit: Payer: Self-pay

## 2020-04-16 VITALS — BP 120/64 | HR 80 | Ht 73.0 in | Wt 211.8 lb

## 2020-04-16 DIAGNOSIS — R0600 Dyspnea, unspecified: Secondary | ICD-10-CM | POA: Diagnosis not present

## 2020-04-16 DIAGNOSIS — R002 Palpitations: Secondary | ICD-10-CM

## 2020-04-16 DIAGNOSIS — I10 Essential (primary) hypertension: Secondary | ICD-10-CM

## 2020-04-16 DIAGNOSIS — E785 Hyperlipidemia, unspecified: Secondary | ICD-10-CM | POA: Diagnosis not present

## 2020-04-16 DIAGNOSIS — R0609 Other forms of dyspnea: Secondary | ICD-10-CM

## 2020-04-16 NOTE — Patient Instructions (Signed)
Medication Instructions:  Your physician recommends that you continue on your current medications as directed. Please refer to the Current Medication list given to you today.  *If you need a refill on your cardiac medications before your next appointment, please call your pharmacy*   Lab Work: Your physician recommends that you return for lab work today: lipid  If you have labs (blood work) drawn today and your tests are completely normal, you will receive your results only by: . MyChart Message (if you have MyChart) OR . A paper copy in the mail If you have any lab test that is abnormal or we need to change your treatment, we will call you to review the results.   Testing/Procedures: None   Follow-Up: At [facility], you and your health needs are our priority.  As part of our continuing mission to provide you with exceptional heart care, we have created designated Provider Care Teams.  These Care Teams include your primary Cardiologist (physician) and Advanced Practice Providers (APPs -  Physician Assistants and Nurse Practitioners) who all work together to provide you with the care you need, when you need it.  We recommend signing up for the patient portal called "MyChart".  Sign up information is provided on this After Visit Summary.  MyChart is used to connect with patients for Virtual Visits (Telemedicine).  Patients are able to view lab/test results, encounter notes, upcoming appointments, etc.  Non-urgent messages can be sent to your provider as well.   To learn more about what you can do with MyChart, go to [email or web address].    Your next appointment:   3 month(s)  The format for your next appointment:   In Person  Provider:   Robert Krasowski, MD   Other Instructions   

## 2020-04-16 NOTE — Progress Notes (Signed)
Cardiology Office Note:    Date:  04/16/2020   ID:  CLINTEN HOWK, DOB 1950/12/06, MRN [id number]  PCP:  Nicoletta Dress, MD  Cardiologist:  Jenne Campus, MD    Referring MD: Nicoletta Dress, MD   No chief complaint on file. Doing fine  History of Present Illness:    Kyle [name] is a 69 y.o. male came to me because of multiple issues.  Concern is about high blood pressure.  He thinks he does have whitecoat hypertension, however, today his blood pressure is good.  He did have an echocardiogram done which showed mild left ventricle hypertrophy with wall thickness of 1.2 cm.  Described also to have some palpitations, he did wear monitor which showed some nonsustained supraventricular tachycardia, longest episode only 9 beats however those were asymptomatic.  Triggered event showed only sinus tachycardia.  He did not have cholesterol done.  Since have seen him last time he is doing well denies having any much palpitations.  Still very concerned about his health and he willing to do anything he can to improve overall he is outcomes.  Past Medical History:  Diagnosis Date  . Asthma   . Cluster headache   . Complication of anesthesia 1990   Difficult to arouse and bronchospasm  . Prostate cancer ([place]) 2020  . Reflux   . Sinusitis     Past Surgical History:  Procedure Laterality Date  . bone spur removed    . KNEE ARTHROPLASTY     x 2  . SINOSCOPY     6 SURGERIES  . tear duct replacement    . TOTAL HIP ARTHROPLASTY Left 02/13/2019   Procedure: TOTAL HIP ARTHROPLASTY ANTERIOR APPROACH;  Surgeon: Melrose Nakayama, MD;  Location: [facility];  Service: Orthopedics;  Laterality: Left;    Current Medications: Current Meds  Medication Sig  . albuterol (PROAIR HFA) 108 (90 Base) MCG/ACT inhaler Inhale two puffs every 4-6 hours if needed for cough or wheeze. (Patient taking differently: Inhale 2 puffs into the lungs every 6 (six) hours as needed for wheezing or shortness  of breath. )  . albuterol (PROVENTIL) (2.5 MG/3ML) 0.083% nebulizer solution Take 2.5 mg by nebulization every 4 (four) hours as needed for wheezing or shortness of breath.   Marland Kitchen albuterol (PROVENTIL) (2.5 MG/3ML) 0.083% nebulizer solution Take 3 mLs (2.5 mg total) by nebulization every 6 (six) hours as needed for wheezing or shortness of breath.  Marland Kitchen albuterol (VENTOLIN HFA) 108 (90 Base) MCG/ACT inhaler Inhale 2 puffs into the lungs every 4 (four) hours as needed for wheezing or shortness of breath.  . ALPRAZolam (XANAX) 0.5 MG tablet Take 1 (one) Tablet three times daily, as needed  . Ascorbic Acid (VITAMIN C) 1000 MG tablet Take 1,000 mg by mouth daily.  . beclomethasone (QVAR REDIHALER) 80 MCG/ACT inhaler Inhale two doses twice daily during flare-up as directed. Rinse, gargle, and spit after use.  . budesonide-formoterol (SYMBICORT) 160-4.5 MCG/ACT inhaler USE 2 INHALATIONS ORALLY TWICE DAILY TO PREVENT COUGH OR WHEEZE. RINSE, GARGLE AND SPIT AFTER USE  . Carboxymethylcellul-Glycerin (LUBRICATING EYE DROPS OP) Place 1 drop into both eyes daily as needed (irritation).  Marland Kitchen EPINEPHrine 0.3 mg/0.3 mL IJ SOAJ injection Use as directed for life-threatening allergic reaction.  Marland Kitchen esomeprazole (NEXIUM) 40 MG capsule TAKE 1 CAPSULE DAILY. CAN  INCREASE TO TWO TIMES A DAYDURING FLARE-UP  . GARLIC PO Take 1 tablet by mouth daily.   . Ginger, Zingiber officinalis, (GINGER PO) Take 1 tablet by  mouth daily.   . hydrOXYzine (VISTARIL) 25 MG capsule Take 1 capsule by mouth daily as needed.   Marland Kitchen LORazepam (ATIVAN) 0.5 MG tablet Take 0.5 mg by mouth 2 (two) times daily as needed.  . Melatonin 5 MG CAPS Take 5 mg by mouth at bedtime as needed (sleep).  . Misc Natural Products (TURMERIC CURCUMIN) CAPS Take 1 capsule by mouth daily.  . mometasone (NASONEX) 50 MCG/ACT nasal spray Use 1-2 sprays in each nostril once daily. (Patient taking differently: Place 1 spray into the nose 2 (two) times a day. )  . Multiple  Vitamins-Minerals (MULTIVITAMIN PO) Take 1 tablet by mouth daily.   . pseudoephedrine-guaifenesin (MUCINEX D) 60-600 MG 12 hr tablet Take 1 tablet by mouth every 12 (twelve) hours.  Marland Kitchen Respiratory Therapy Supplies (NEBULIZER) DEVI 1 Device by Does not apply route as needed (use nebulizer if having cough, wheeze, shortness of breath, difficulty breathing).   Current Facility-Administered Medications for the 04/16/20 encounter (Office Visit) with Park Liter, MD  Medication  . Benralizumab SOSY 30 mg     Allergies:   Augmentin [amoxicillin-pot clavulanate] and Levaquin [levofloxacin in d5w]   Social History   Socioeconomic History  . Marital status: Married    Spouse name: Not on file  . Number of children: 2  . Years of education: Masters  . Highest education level: Not on file  Occupational History  . Occupation: Production assistant, radio    Comment: Retired Forensic psychologist  Tobacco Use  . Smoking status: Never Smoker  . Smokeless tobacco: Never Used  Vaping Use  . Vaping Use: Never used  Substance and Sexual Activity  . Alcohol use: No  . Drug use: No  . Sexual activity: Not Currently  Other Topics Concern  . Not on file  Social History Narrative   Lives at home with his wife.   Right-handed.   2.5 - 3 cups coffee per day.   Social Determinants of Health   Financial Resource Strain:   . Difficulty of Paying Living Expenses:   Food Insecurity:   . Worried About Charity fundraiser in the Last Year:   . Arboriculturist in the Last Year:   Transportation Needs:   . Film/video editor (Medical):   Marland Kitchen Lack of Transportation (Non-Medical):   Physical Activity:   . Days of Exercise per Week:   . Minutes of Exercise per Session:   Stress:   . Feeling of Stress :   Social Connections:   . Frequency of Communication with Friends and Family:   . Frequency of Social Gatherings with Friends and Family:   . Attends Religious Services:   . Active Member of Clubs or  Organizations:   . Attends Archivist Meetings:   Marland Kitchen Marital Status:      Family History: The patient's family history includes Asthma in his father; Emphysema in his mother; Heart attack in his mother; Stroke in his father. ROS:   Please see the history of present illness.    All 14 point review of systems negative except as described per history of present illness  EKGs/Labs/Other Studies Reviewed:      Recent Labs: No results found for requested labs within last 8760 hours.  Recent Lipid Panel    Component Value Date/Time   CHOL 195 07/11/2017 0900   TRIG 57 07/11/2017 0900   HDL 65 07/11/2017 0900   CHOLHDL 3.0 07/11/2017 0900   LDLCALC 119 (H) 07/11/2017 0900  Physical Exam:    VS:  BP 120/64   Pulse 80   Ht 6\' 1"  (1.854 m)   Wt 211 lb 12.8 oz (96.1 kg)   SpO2 97%   BMI 27.94 kg/m     Wt Readings from Last 3 Encounters:  04/16/20 211 lb 12.8 oz (96.1 kg)  03/03/20 211 lb (95.7 kg)  12/05/19 220 lb 12.8 oz (100.2 kg)     GEN:  Well nourished, well developed in no acute distress HEENT: Normal NECK: No JVD; No carotid bruits LYMPHATICS: No lymphadenopathy CARDIAC: RRR, no murmurs, no rubs, no gallops RESPIRATORY:  Clear to auscultation without rales, wheezing or rhonchi  ABDOMEN: Soft, non-tender, non-distended MUSCULOSKELETAL:  No edema; No deformity  SKIN: Warm and dry LOWER EXTREMITIES: no swelling NEUROLOGIC:  Alert and oriented x 3 PSYCHIATRIC:  Normal affect   ASSESSMENT:    1. Essential hypertension   2. Palpitations   3. Dyspnea on exertion   4. Dyslipidemia    PLAN:    In order of problems listed above:  1. Essential hypertension we talked in length about the situation.  Of her medications, however he preferred to go with nonmedical approach.  Therefore, we discussed about need to exercise avoiding salty food and also relaxation technique.  He wants to join as in our yoga session tomorrow hopefully he will have a chance to do  that. 2. Palpitations: Monitor showed some SVT.  I offered some medications however since he is asymptomatic right now he does not want to do that. 3. Dyspnea on exertion, echocardiogram showed preserved left ventricle ejection fraction, relaxation abnormalities. 4. Dyslipidemia: Fasting lipid profile will be done today.   Medication Adjustments/Labs and Tests Ordered: Current medicines are reviewed at length with the patient today.  Concerns regarding medicines are outlined above.  Orders Placed This Encounter  Procedures  . Lipid panel   Medication changes: No orders of the defined types were placed in this encounter.   Signed, Park Liter, MD, [facility] 04/16/2020 11:01 AM    [place]

## 2020-04-17 DIAGNOSIS — D485 Neoplasm of uncertain behavior of skin: Secondary | ICD-10-CM | POA: Diagnosis not present

## 2020-04-17 DIAGNOSIS — L820 Inflamed seborrheic keratosis: Secondary | ICD-10-CM | POA: Diagnosis not present

## 2020-04-17 LAB — LIPID PANEL
Chol/HDL Ratio: 3.4 ratio (ref 0.0–5.0)
Cholesterol, Total: 239 mg/dL — ABNORMAL HIGH (ref 100–199)
HDL: 70 mg/dL (ref >39)
LDL Chol Calc (NIH): 156 mg/dL — ABNORMAL HIGH (ref 0–99)
Triglycerides: 76 mg/dL (ref 0–149)
VLDL Cholesterol Cal: 13 mg/dL (ref 5–40)

## 2020-04-18 ENCOUNTER — Telehealth: Payer: Self-pay

## 2020-04-18 DIAGNOSIS — E785 Hyperlipidemia, unspecified: Secondary | ICD-10-CM

## 2020-04-18 DIAGNOSIS — Z96642 Presence of left artificial hip joint: Secondary | ICD-10-CM | POA: Diagnosis not present

## 2020-04-18 DIAGNOSIS — M7062 Trochanteric bursitis, left hip: Secondary | ICD-10-CM | POA: Diagnosis not present

## 2020-04-18 MED ORDER — ATORVASTATIN CALCIUM 10 MG PO TABS
10.0000 mg | ORAL_TABLET | Freq: Every day | ORAL | 3 refills | Status: DC
Start: 2020-04-18 — End: 2020-12-04

## 2020-04-18 NOTE — Telephone Encounter (Signed)
Spoke with patient regarding results and recommendation.  Patient verbalizes understanding and is agreeable to plan of care. Advised patient to call back with any issues or concerns.  

## 2020-04-18 NOTE — Telephone Encounter (Signed)
-----   Message from Park Liter, MD sent at 04/17/2020  1:52 PM EDT ----- Chol elevated, 10 y risk 14.3% Advise to start lipitor 10 mg po qd  Flp ast alt in 6 weeks

## 2020-05-05 DIAGNOSIS — L578 Other skin changes due to chronic exposure to nonionizing radiation: Secondary | ICD-10-CM | POA: Diagnosis not present

## 2020-05-05 DIAGNOSIS — L821 Other seborrheic keratosis: Secondary | ICD-10-CM | POA: Diagnosis not present

## 2020-05-05 DIAGNOSIS — C44529 Squamous cell carcinoma of skin of other part of trunk: Secondary | ICD-10-CM | POA: Diagnosis not present

## 2020-05-12 ENCOUNTER — Other Ambulatory Visit: Payer: Self-pay | Admitting: Allergy and Immunology

## 2020-05-20 DIAGNOSIS — J4550 Severe persistent asthma, uncomplicated: Secondary | ICD-10-CM

## 2020-05-21 ENCOUNTER — Ambulatory Visit (INDEPENDENT_AMBULATORY_CARE_PROVIDER_SITE_OTHER): Payer: Medicare Other | Admitting: *Deleted

## 2020-05-21 ENCOUNTER — Other Ambulatory Visit: Payer: Self-pay

## 2020-05-21 DIAGNOSIS — J4550 Severe persistent asthma, uncomplicated: Secondary | ICD-10-CM

## 2020-07-15 DIAGNOSIS — J4550 Severe persistent asthma, uncomplicated: Secondary | ICD-10-CM

## 2020-07-16 ENCOUNTER — Other Ambulatory Visit: Payer: Self-pay

## 2020-07-16 ENCOUNTER — Ambulatory Visit (INDEPENDENT_AMBULATORY_CARE_PROVIDER_SITE_OTHER): Payer: Medicare Other | Admitting: *Deleted

## 2020-07-16 DIAGNOSIS — J4550 Severe persistent asthma, uncomplicated: Secondary | ICD-10-CM | POA: Diagnosis not present

## 2020-07-22 ENCOUNTER — Other Ambulatory Visit: Payer: Self-pay

## 2020-07-22 ENCOUNTER — Ambulatory Visit (INDEPENDENT_AMBULATORY_CARE_PROVIDER_SITE_OTHER): Payer: Medicare Other | Admitting: Cardiology

## 2020-07-22 ENCOUNTER — Encounter: Payer: Self-pay | Admitting: Cardiology

## 2020-07-22 VITALS — BP 140/80 | HR 78 | Ht 73.0 in | Wt 215.0 lb

## 2020-07-22 DIAGNOSIS — R0609 Other forms of dyspnea: Secondary | ICD-10-CM

## 2020-07-22 DIAGNOSIS — E785 Hyperlipidemia, unspecified: Secondary | ICD-10-CM | POA: Diagnosis not present

## 2020-07-22 DIAGNOSIS — R0600 Dyspnea, unspecified: Secondary | ICD-10-CM | POA: Diagnosis not present

## 2020-07-22 DIAGNOSIS — I10 Essential (primary) hypertension: Secondary | ICD-10-CM | POA: Diagnosis not present

## 2020-07-22 NOTE — Patient Instructions (Signed)
Medication Instructions:  Your physician recommends that you continue on your current medications as directed. Please refer to the Current Medication list given to you today.  *If you need a refill on your cardiac medications before your next appointment, please call your pharmacy*   Lab Work: Your physician recommends that you return for lab work in 1 month: lipid no appointment needed please come anytime M-F between 8am-12pm and 2pm-430pm  If you have labs (blood work) drawn today and your tests are completely normal, you will receive your results only by: Marland Kitchen MyChart Message (if you have MyChart) OR . A paper copy in the mail If you have any lab test that is abnormal or we need to change your treatment, we will call you to review the results.   Testing/Procedures: None   Follow-Up: At [facility], you and your health needs are our priority.  As part of our continuing mission to provide you with exceptional heart care, we have created designated Provider Care Teams.  These Care Teams include your primary Cardiologist (physician) and Advanced Practice Providers (APPs -  Physician Assistants and Nurse Practitioners) who all work together to provide you with the care you need, when you need it.  We recommend signing up for the patient portal called "MyChart".  Sign up information is provided on this After Visit Summary.  MyChart is used to connect with patients for Virtual Visits (Telemedicine).  Patients are able to view lab/test results, encounter notes, upcoming appointments, etc.  Non-urgent messages can be sent to your provider as well.   To learn more about what you can do with MyChart, go to [email or web address].    Your next appointment:   5 month(s)  The format for your next appointment:   In Person  Provider:   Jenne Campus, MD   Other Instructions

## 2020-07-22 NOTE — Progress Notes (Signed)
Cardiology Office Note:    Date:  07/22/2020   ID:  TIMM BONENBERGER, DOB 26-May-1951, MRN [id number]  PCP:  Nicoletta Dress, MD  Cardiologist:  Jenne Campus, MD    Referring MD: Nicoletta Dress, MD   Chief Complaint  Patient presents with  . Follow-up  I am doing well  History of Present Illness:    Kyle [name] is a 69 y.o. male with past medical history significant for essential hypertension which appears to be whitecoat hypertension, dyslipidemia, dyspnea on exertion.  Quite extensive evaluation has been done which was practically unrevealing.  Because of palpitation he wore monitor which showed some nonsustained supraventricular tachycardia.  He does not want any treatment for it, echocardiogram showed preserved left ventricle ejection fraction but there was mild degree of left ventricle hypertrophy.  We did have his cholesterol done which show LDL of 156 luckily his HDL is 70.  He comes today to talk about his issues.  He is asymptomatic there is no chest pain tightness squeezing pressure burning chest.  He walks on the regular basis he used to run however knee problem prevent him from doing this.  There is no palpitations lately  Past Medical History:  Diagnosis Date  . Asthma   . Cluster headache   . Complication of anesthesia 1990   Difficult to arouse and bronchospasm  . Prostate cancer ([place]) 2020  . Reflux   . Sinusitis     Past Surgical History:  Procedure Laterality Date  . bone spur removed    . KNEE ARTHROPLASTY     x 2  . SINOSCOPY     6 SURGERIES  . tear duct replacement    . TOTAL HIP ARTHROPLASTY Left 02/13/2019   Procedure: TOTAL HIP ARTHROPLASTY ANTERIOR APPROACH;  Surgeon: Melrose Nakayama, MD;  Location: [facility];  Service: Orthopedics;  Laterality: Left;    Current Medications: Current Meds  Medication Sig  . albuterol (PROVENTIL) (2.5 MG/3ML) 0.083% nebulizer solution Take 3 mLs (2.5 mg total) by nebulization every 6 (six) hours  as needed for wheezing or shortness of breath.  Marland Kitchen albuterol (VENTOLIN HFA) 108 (90 Base) MCG/ACT inhaler Inhale 2 puffs into the lungs every 4 (four) hours as needed for wheezing or shortness of breath.  . ALPRAZolam (XANAX) 0.5 MG tablet Take 0.5 mg by mouth as needed.   . Ascorbic Acid (VITAMIN C) 1000 MG tablet Take 1,000 mg by mouth daily.  Marland Kitchen atorvastatin (LIPITOR) 10 MG tablet Take 1 tablet (10 mg total) by mouth daily.  . beclomethasone (QVAR REDIHALER) 80 MCG/ACT inhaler Inhale two doses twice daily during flare-up as directed. Rinse, gargle, and spit after use.  . budesonide-formoterol (SYMBICORT) 160-4.5 MCG/ACT inhaler USE 2 INHALATIONS ORALLY TWICE DAILY TO PREVENT COUGH OR WHEEZE. RINSE, GARGLE AND SPIT AFTER USE  . Carboxymethylcellul-Glycerin (LUBRICATING EYE DROPS OP) Place 1 drop into both eyes daily as needed (irritation).  Marland Kitchen EPINEPHrine 0.3 mg/0.3 mL IJ SOAJ injection Use as directed for life-threatening allergic reaction.  Marland Kitchen esomeprazole (NEXIUM) 40 MG capsule TAKE 1 CAPSULE DAILY. CAN  INCREASE TO TWO TIMES A DAYDURING FLARE-UP  . GARLIC PO Take 1 tablet by mouth daily.   . hydrOXYzine (VISTARIL) 25 MG capsule Take 1 capsule by mouth daily as needed.   . Melatonin 5 MG CAPS Take 5 mg by mouth at bedtime as needed (sleep).  . Misc Natural Products (TURMERIC CURCUMIN) CAPS Take 1 capsule by mouth daily.  . mometasone (NASONEX) 50 MCG/ACT nasal  spray Use 1-2 sprays in each nostril once daily. (Patient taking differently: Place 1 spray into the nose 2 (two) times a day. )  . Multiple Vitamins-Minerals (MULTIVITAMIN PO) Take 1 tablet by mouth daily.   . Multiple Vitamins-Minerals (ZINC PO) Take by mouth daily.  . pseudoephedrine-guaifenesin (MUCINEX D) 60-600 MG 12 hr tablet Take 1 tablet by mouth every 12 (twelve) hours.  Marland Kitchen Respiratory Therapy Supplies (NEBULIZER) DEVI 1 Device by Does not apply route as needed (use nebulizer if having cough, wheeze, shortness of breath, difficulty  breathing).  Marland Kitchen VITAMIN D PO Take by mouth daily.   Current Facility-Administered Medications for the 07/22/20 encounter (Office Visit) with Park Liter, MD  Medication  . Benralizumab SOSY 30 mg     Allergies:   Augmentin [amoxicillin-pot clavulanate] and Levaquin [levofloxacin in d5w]   Social History   Socioeconomic History  . Marital status: Married    Spouse name: Not on file  . Number of children: 2  . Years of education: Masters  . Highest education level: Not on file  Occupational History  . Occupation: Production assistant, radio    Comment: Retired Forensic psychologist  Tobacco Use  . Smoking status: Never Smoker  . Smokeless tobacco: Never Used  Vaping Use  . Vaping Use: Never used  Substance and Sexual Activity  . Alcohol use: No  . Drug use: No  . Sexual activity: Not Currently  Other Topics Concern  . Not on file  Social History Narrative   Lives at home with his wife.   Right-handed.   2.5 - 3 cups coffee per day.   Social Determinants of Health   Financial Resource Strain:   . Difficulty of Paying Living Expenses: Not on file  Food Insecurity:   . Worried About Charity fundraiser in the Last Year: Not on file  . Ran Out of Food in the Last Year: Not on file  Transportation Needs:   . Lack of Transportation (Medical): Not on file  . Lack of Transportation (Non-Medical): Not on file  Physical Activity:   . Days of Exercise per Week: Not on file  . Minutes of Exercise per Session: Not on file  Stress:   . Feeling of Stress : Not on file  Social Connections:   . Frequency of Communication with Friends and Family: Not on file  . Frequency of Social Gatherings with Friends and Family: Not on file  . Attends Religious Services: Not on file  . Active Member of Clubs or Organizations: Not on file  . Attends Archivist Meetings: Not on file  . Marital Status: Not on file     Family History: The patient's family history includes Asthma in his father;  Emphysema in his mother; Heart attack in his mother; Stroke in his father. ROS:   Please see the history of present illness.    All 14 point review of systems negative except as described per history of present illness  EKGs/Labs/Other Studies Reviewed:      Recent Labs: No results found for requested labs within last 8760 hours.  Recent Lipid Panel    Component Value Date/Time   CHOL 239 (H) 04/16/2020 1106   TRIG 76 04/16/2020 1106   HDL 70 04/16/2020 1106   CHOLHDL 3.4 04/16/2020 1106   LDLCALC 156 (H) 04/16/2020 1106    Physical Exam:    VS:  BP 140/80 (BP Location: Right Arm, Patient Position: Sitting, Cuff Size: Normal)   Pulse 78  Ht 6\' 1"  (1.854 m)   Wt 215 lb (97.5 kg)   SpO2 97%   BMI 28.37 kg/m     Wt Readings from Last 3 Encounters:  07/22/20 215 lb (97.5 kg)  04/16/20 211 lb 12.8 oz (96.1 kg)  03/03/20 211 lb (95.7 kg)     GEN:  Well nourished, well developed in no acute distress HEENT: Normal NECK: No JVD; No carotid bruits LYMPHATICS: No lymphadenopathy CARDIAC: RRR, no murmurs, no rubs, no gallops RESPIRATORY:  Clear to auscultation without rales, wheezing or rhonchi  ABDOMEN: Soft, non-tender, non-distended MUSCULOSKELETAL:  No edema; No deformity  SKIN: Warm and dry LOWER EXTREMITIES: no swelling NEUROLOGIC:  Alert and oriented x 3 PSYCHIATRIC:  Normal affect   ASSESSMENT:    1. Essential hypertension   2. Dyspnea on exertion   3. Dyslipidemia    PLAN:    In order of problems listed above:  1. Essential hypertension blood pressure slightly elevated in office but when he check it at home usually 130/80.  I told him to keep an eye on this in the future he may require small dose of medication to help with his blood pressure reaction.  Again we discussed 1 more time issue of nonpharmacological management of his essential hypertension. 2. Dyspnea exertion much better he has become much more active able to walk climb stairs with no  difficulty. 3.  4. Dyslipidemia.  He does have interesting fasting lipid profile I did calculate his 10-year his ASCVD risk score which came 12.7 which is intermediate category.  He is on Lipitor 10 however when he started taking that medications he started having some stomach issue he stopped it and he restarted back about a week ago.  So so far he has been taking Lipitor 10 mg only for a week.  We will schedule him to have fasting lipid profile done in about 1 month.  I did analyze his K PN data showing L HDL 70 and LDL 156.   Medication Adjustments/Labs and Tests Ordered: Current medicines are reviewed at length with the patient today.  Concerns regarding medicines are outlined above.  No orders of the defined types were placed in this encounter.  Medication changes: No orders of the defined types were placed in this encounter.   Signed, Park Liter, MD, [facility] 07/22/2020 8:27 AM    [place]

## 2020-07-23 ENCOUNTER — Other Ambulatory Visit: Payer: Self-pay | Admitting: Allergy and Immunology

## 2020-08-18 DIAGNOSIS — R351 Nocturia: Secondary | ICD-10-CM | POA: Diagnosis not present

## 2020-08-18 DIAGNOSIS — C61 Malignant neoplasm of prostate: Secondary | ICD-10-CM | POA: Diagnosis not present

## 2020-08-27 DIAGNOSIS — H2513 Age-related nuclear cataract, bilateral: Secondary | ICD-10-CM | POA: Diagnosis not present

## 2020-08-27 DIAGNOSIS — H524 Presbyopia: Secondary | ICD-10-CM | POA: Diagnosis not present

## 2020-09-03 DIAGNOSIS — M1711 Unilateral primary osteoarthritis, right knee: Secondary | ICD-10-CM | POA: Diagnosis not present

## 2020-09-03 DIAGNOSIS — M25561 Pain in right knee: Secondary | ICD-10-CM | POA: Diagnosis not present

## 2020-09-09 DIAGNOSIS — J4550 Severe persistent asthma, uncomplicated: Secondary | ICD-10-CM

## 2020-09-10 ENCOUNTER — Ambulatory Visit (INDEPENDENT_AMBULATORY_CARE_PROVIDER_SITE_OTHER): Payer: Medicare Other | Admitting: *Deleted

## 2020-09-10 ENCOUNTER — Other Ambulatory Visit: Payer: Self-pay

## 2020-09-10 DIAGNOSIS — J4550 Severe persistent asthma, uncomplicated: Secondary | ICD-10-CM

## 2020-09-13 DIAGNOSIS — U071 COVID-19: Secondary | ICD-10-CM

## 2020-09-13 HISTORY — DX: COVID-19: U07.1

## 2020-09-27 DIAGNOSIS — Z20828 Contact with and (suspected) exposure to other viral communicable diseases: Secondary | ICD-10-CM | POA: Diagnosis not present

## 2020-09-27 DIAGNOSIS — M791 Myalgia, unspecified site: Secondary | ICD-10-CM | POA: Diagnosis not present

## 2020-09-27 DIAGNOSIS — R509 Fever, unspecified: Secondary | ICD-10-CM | POA: Diagnosis not present

## 2020-09-27 DIAGNOSIS — J069 Acute upper respiratory infection, unspecified: Secondary | ICD-10-CM | POA: Diagnosis not present

## 2020-09-30 DIAGNOSIS — R6889 Other general symptoms and signs: Secondary | ICD-10-CM | POA: Diagnosis not present

## 2020-09-30 DIAGNOSIS — B370 Candidal stomatitis: Secondary | ICD-10-CM | POA: Diagnosis not present

## 2020-09-30 DIAGNOSIS — J069 Acute upper respiratory infection, unspecified: Secondary | ICD-10-CM | POA: Diagnosis not present

## 2020-10-10 ENCOUNTER — Telehealth: Payer: Self-pay

## 2020-10-10 NOTE — Telephone Encounter (Signed)
ERROR

## 2020-10-13 ENCOUNTER — Ambulatory Visit (INDEPENDENT_AMBULATORY_CARE_PROVIDER_SITE_OTHER): Payer: Medicare Other | Admitting: Allergy and Immunology

## 2020-10-13 ENCOUNTER — Encounter: Payer: Self-pay | Admitting: Allergy and Immunology

## 2020-10-13 ENCOUNTER — Other Ambulatory Visit: Payer: Self-pay

## 2020-10-13 VITALS — BP 160/88 | HR 80 | Resp 16 | Ht 73.0 in | Wt 211.6 lb

## 2020-10-13 DIAGNOSIS — K219 Gastro-esophageal reflux disease without esophagitis: Secondary | ICD-10-CM | POA: Diagnosis not present

## 2020-10-13 DIAGNOSIS — J4550 Severe persistent asthma, uncomplicated: Secondary | ICD-10-CM

## 2020-10-13 DIAGNOSIS — J3089 Other allergic rhinitis: Secondary | ICD-10-CM | POA: Diagnosis not present

## 2020-10-13 DIAGNOSIS — U071 COVID-19: Secondary | ICD-10-CM | POA: Diagnosis not present

## 2020-10-13 DIAGNOSIS — J984 Other disorders of lung: Secondary | ICD-10-CM | POA: Diagnosis not present

## 2020-10-13 DIAGNOSIS — J479 Bronchiectasis, uncomplicated: Secondary | ICD-10-CM | POA: Diagnosis not present

## 2020-10-13 DIAGNOSIS — Z8709 Personal history of other diseases of the respiratory system: Secondary | ICD-10-CM

## 2020-10-13 MED ORDER — ALBUTEROL SULFATE HFA 108 (90 BASE) MCG/ACT IN AERS: 2.0000 | INHALATION_SPRAY | RESPIRATORY_TRACT | 1 refills | Status: DC | PRN

## 2020-10-13 MED ORDER — QVAR REDIHALER 80 MCG/ACT IN AERB: INHALATION_SPRAY | RESPIRATORY_TRACT | 2 refills | Status: DC

## 2020-10-13 MED ORDER — BUDESONIDE-FORMOTEROL FUMARATE 160-4.5 MCG/ACT IN AERO: INHALATION_SPRAY | RESPIRATORY_TRACT | 2 refills | Status: DC

## 2020-10-13 MED ORDER — ESOMEPRAZOLE MAGNESIUM 40 MG PO CPDR: DELAYED_RELEASE_CAPSULE | ORAL | 2 refills | Status: DC

## 2020-10-13 NOTE — Patient Instructions (Addendum)
  1. Continue Symbicort 160- 2 inhalations 1-2 times a day   2. Continue Nasonex 1-2 sprays each nostril 1 time per day    3. Continue Nexium 40 mg 1 tablet 1 time a day.    4. Continue Benralizumab injections  5.  If needed:   A. Ventolin HFA or similar 2 inhalations every 4-6 hours  B. Daily antihistamine   C. Mucinex DM  6. "Action Plan" for flare up:   A. Add Qvar 80 - 2 inhalations twice a day to Symbicort    B. Increase Nexium to 2 times per day  7. Return to clinic in 12 months or earlier if problem

## 2020-10-13 NOTE — Progress Notes (Signed)
[place] [facility] [place] [facility] [place] [facility] [place] [facility] [place]   Follow-up Note  Referring Provider: Paulina Fusi, MD Primary Provider: Paulina Fusi, MD Date of Office Visit: 10/13/2020  Subjective:   Kyle [name] (DOB: 1950/11/06) is a 70 y.o. male who [facility] in re-evaluation of the following:  HPI: Thayer Ohm returns to this clinic in evaluation of asthma and a history of bronchiectasis and a history of chronic sinusitis and allergic rhinitis and LPR.  His last visit to this clinic was 05 December 2019.  About 17 days ago he contracted Covid manifested as a febrile illness with systemic symptoms and head pressure and sinus congestion and a bad sore throat and a cough.  He is actually doing quite well at this point in time other than the fact that he still has some irritated throat and some coughing spells.  He went to the urgent care center where he was swab positive and then he went to see his primary care doctor where he was given prednisone but he did not use any prednisone to date.  He has had 2 Moderna Covid vaccines already.  Prior to this point in time he was really doing very well and had tapered down to Symbicort to 1 time per day and his nose was doing very well while using Nasonex and his reflux was under very good control while using Nexium in the context of consistently using his benralizumab injections to address his multiorgan eosinophilic driven airway disease.  The only other issue that is going on at this point in time is that he is pondering the option of obtaining a right knee replacement.  Allergies as of 10/13/2020      Reactions   Augmentin [amoxicillin-pot Clavulanate]    Terrible heartburn Did it involve swelling of the face/tongue/throat, SOB, or low BP? No Did it involve sudden or severe rash/hives, skin peeling, or any reaction on the inside of your mouth or nose? No Did you need to seek medical  attention at a hospital or doctor's office? No When did it last happen?2 years If all above answers are NO, may proceed with cephalosporin use.   Levaquin [levofloxacin In D5w]    Terrible heartburn      Medication List      albuterol 108 (90 Base) MCG/ACT inhaler Commonly known as: VENTOLIN HFA Inhale 2 puffs into the lungs every 4 (four) hours as needed for wheezing or shortness of breath.   albuterol (2.5 MG/3ML) 0.083% nebulizer solution Commonly known as: PROVENTIL Take 3 mLs (2.5 mg total) by nebulization every 6 (six) hours as needed for wheezing or shortness of breath.   ALPRAZolam 0.5 MG tablet Commonly known as: XANAX Take 0.5 mg by mouth as needed.   atorvastatin 10 MG tablet Commonly known as: Lipitor Take 1 tablet (10 mg total) by mouth daily.   budesonide-formoterol 160-4.5 MCG/ACT inhaler Commonly known as: SYMBICORT USE 2 INHALATIONS ORALLY TWICE DAILY TO PREVENT COUGH OR WHEEZE. RINSE, GARGLE AND SPIT AFTER USE   EPINEPHrine 0.3 mg/0.3 mL Soaj injection Commonly known as: EPI-PEN Use as directed for life-threatening allergic reaction.   esomeprazole 40 MG capsule Commonly known as: NEXIUM TAKE 1 CAPSULE DAILY. CAN  INCREASE TO TWO TIMES A DAYDURING FLARE-UP   GARLIC PO Take 1 tablet by mouth daily.   hydrOXYzine 25 MG capsule Commonly known as: VISTARIL Take 1 capsule by mouth daily as needed.   LUBRICATING EYE DROPS  OP Place 1 drop into both eyes daily as needed (irritation).   Melatonin 5 MG Caps Take 5 mg by mouth at bedtime as needed (sleep).   mometasone 50 MCG/ACT nasal spray Commonly known as: NASONEX Use 1-2 sprays in each nostril once daily. What changed:    MULTIVITAMIN PO Take 1 tablet by mouth daily.   Nebulizer Devi 1 Device by Does not apply route as needed (use nebulizer if having cough, wheeze, shortness of breath, difficulty breathing).   pseudoephedrine-guaifenesin 60-600 MG 12 hr tablet Commonly known as:  MUCINEX D Take 1 tablet by mouth every 12 (twelve) hours.   Qvar RediHaler 80 MCG/ACT inhaler Generic drug: beclomethasone Inhale two doses twice daily during flare-up as directed. Rinse, gargle, and spit after use.   Turmeric Curcumin Caps Take 1 capsule by mouth daily.   vitamin C 1000 MG tablet Take 1,000 mg by mouth daily.   VITAMIN D PO Take by mouth daily.   ZINC PO Take by mouth daily.       Past Medical History:  Diagnosis Date   Asthma    Cluster headache    Complication of anesthesia 1990   Difficult to arouse and bronchospasm   Prostate cancer ([place]) 2020   Reflux    Sinusitis     Past Surgical History:  Procedure Laterality Date   bone spur removed     KNEE ARTHROPLASTY     x 2   SINOSCOPY     6 SURGERIES   tear duct replacement     TOTAL HIP ARTHROPLASTY Left 02/13/2019   Procedure: TOTAL HIP ARTHROPLASTY ANTERIOR APPROACH;  Surgeon: Melrose Nakayama, MD;  Location: [facility];  Service: Orthopedics;  Laterality: Left;    Review of systems negative except as noted in HPI / PMHx or noted below:  Review of Systems  Constitutional: Negative.   HENT: Negative.   Eyes: Negative.   Respiratory: Negative.   Cardiovascular: Negative.   Gastrointestinal: Negative.   Genitourinary: Negative.   Musculoskeletal: Negative.   Skin: Negative.   Neurological: Negative.   Endo/Heme/Allergies: Negative.   Psychiatric/Behavioral: Negative.      Objective:   Vitals:   10/13/20 1343  BP: (!) 160/88  Pulse: 80  Resp: 16  SpO2: 98%   Height: 6\' 1"  (185.4 cm)  Weight: 211 lb 9.6 oz (96 kg)   Physical Exam Constitutional:      Appearance: He is not diaphoretic.  HENT:     Head: Normocephalic.     Right Ear: Tympanic membrane, ear canal and external ear normal.     Left Ear: Tympanic membrane, ear canal and external ear normal.     Nose: Nose normal. No mucosal edema or rhinorrhea.     Mouth/Throat:     Mouth: Oropharynx is clear and moist and  mucous membranes are normal.     Pharynx: Uvula midline. No oropharyngeal exudate.  Eyes:     Conjunctiva/sclera: Conjunctivae normal.  Neck:     Thyroid: No thyromegaly.     Trachea: Trachea normal. No tracheal tenderness or tracheal deviation.  Cardiovascular:     Rate and Rhythm: Normal rate and regular rhythm.     Heart sounds: Normal heart sounds, S1 normal and S2 normal. No murmur heard.   Pulmonary:     Effort: No respiratory distress.     Breath sounds: Normal breath sounds. No stridor. No wheezing or rales.  Musculoskeletal:        General: No edema.  Lymphadenopathy:  Head:     Right side of head: No tonsillar adenopathy.     Left side of head: No tonsillar adenopathy.     Cervical: No cervical adenopathy.  Skin:    Findings: No erythema or rash.     Nails: There is no clubbing.  Neurological:     Mental Status: He is alert.     Diagnostics:    Spirometry was performed and demonstrated an FEV1 of 1.99 at 57  % of predicted.  Assessment and Plan:   1. Asthma, severe persistent, well-controlled   2. Bronchiectasis without complication (HCC)   3. Other allergic rhinitis   4. History of chronic sinusitis   5. LPRD (laryngopharyngeal reflux disease)   6. COVID-19 with pulmonary comorbidity     1. Continue Symbicort 160- 2 inhalations 1-2 times a day   2. Continue Nasonex 1-2 sprays each nostril 1 time per day    3. Continue Nexium 40 mg 1 tablet 1 time a day.    4. Continue Benralizumab injections  5.  If needed:   A. Ventolin HFA or similar 2 inhalations every 4-6 hours  B. Daily antihistamine   C. Mucinex DM  6. "Action Plan" for flare up:   A. Add Qvar 80 - 2 inhalations twice a day to Symbicort    B. Increase Nexium to 2 times per day  7. Return to clinic in 12 months or earlier if problem   [name] was doing quite well and the only major respiratory tract event that appears to have occurred over the course of the past 10 months has been the  development of Covid-19 infection about 2 weeks ago and he appears to be having some lingering inflammation of his respiratory tract as a result of that infection.  He was given prednisone by his primary care doctor and he has not used this medication to date and I told him it was okay to take 10 mg a day for 5 to 10 days but no higher dose at this point in time.  Assuming he does well on the plan noted above I will see him back in his clinic in 1 year or earlier if there is any problem.  He has a very good understanding of his disease state and the appropriate use of his medications depending on disease activity.  Allena Katz, MD Allergy / Immunology [place]

## 2020-10-14 ENCOUNTER — Encounter: Payer: Self-pay | Admitting: Allergy and Immunology

## 2020-10-21 DIAGNOSIS — F410 Panic disorder [episodic paroxysmal anxiety] without agoraphobia: Secondary | ICD-10-CM | POA: Diagnosis not present

## 2020-11-04 DIAGNOSIS — L820 Inflamed seborrheic keratosis: Secondary | ICD-10-CM | POA: Diagnosis not present

## 2020-11-04 DIAGNOSIS — J4550 Severe persistent asthma, uncomplicated: Secondary | ICD-10-CM

## 2020-11-04 DIAGNOSIS — L578 Other skin changes due to chronic exposure to nonionizing radiation: Secondary | ICD-10-CM | POA: Diagnosis not present

## 2020-11-04 DIAGNOSIS — L821 Other seborrheic keratosis: Secondary | ICD-10-CM | POA: Diagnosis not present

## 2020-11-05 ENCOUNTER — Ambulatory Visit (INDEPENDENT_AMBULATORY_CARE_PROVIDER_SITE_OTHER): Payer: Medicare Other

## 2020-11-05 ENCOUNTER — Other Ambulatory Visit: Payer: Self-pay

## 2020-11-05 DIAGNOSIS — J4550 Severe persistent asthma, uncomplicated: Secondary | ICD-10-CM

## 2020-11-19 ENCOUNTER — Other Ambulatory Visit: Payer: Self-pay | Admitting: *Deleted

## 2020-11-19 ENCOUNTER — Other Ambulatory Visit: Payer: Self-pay | Admitting: Cardiology

## 2020-11-19 ENCOUNTER — Other Ambulatory Visit: Payer: Self-pay

## 2020-11-19 DIAGNOSIS — E785 Hyperlipidemia, unspecified: Secondary | ICD-10-CM | POA: Diagnosis not present

## 2020-11-19 LAB — LIPID PANEL
Chol/HDL Ratio: 2.7 ratio (ref 0.0–5.0)
Cholesterol, Total: 167 mg/dL (ref 100–199)
HDL: 63 mg/dL (ref >39)
LDL Chol Calc (NIH): 94 mg/dL (ref 0–99)
Triglycerides: 50 mg/dL (ref 0–149)
VLDL Cholesterol Cal: 10 mg/dL (ref 5–40)

## 2020-11-19 LAB — ALT: ALT: 20 IU/L (ref 0–44)

## 2020-11-19 LAB — AST: AST: 21 IU/L (ref 0–40)

## 2020-11-20 ENCOUNTER — Telehealth: Payer: Self-pay | Admitting: Cardiology

## 2020-11-20 DIAGNOSIS — Z1331 Encounter for screening for depression: Secondary | ICD-10-CM | POA: Diagnosis not present

## 2020-11-20 DIAGNOSIS — Z0000 Encounter for general adult medical examination without abnormal findings: Secondary | ICD-10-CM | POA: Diagnosis not present

## 2020-11-20 DIAGNOSIS — Z9181 History of falling: Secondary | ICD-10-CM | POA: Diagnosis not present

## 2020-11-20 NOTE — Telephone Encounter (Signed)
Called patient informed him of results.

## 2020-11-20 NOTE — Telephone Encounter (Signed)
Patient is returning call to discuss his lab results. 

## 2020-12-04 ENCOUNTER — Other Ambulatory Visit: Payer: Self-pay | Admitting: Cardiology

## 2020-12-24 ENCOUNTER — Other Ambulatory Visit: Payer: Self-pay | Admitting: Allergy and Immunology

## 2020-12-29 DIAGNOSIS — G44009 Cluster headache syndrome, unspecified, not intractable: Secondary | ICD-10-CM | POA: Insufficient documentation

## 2020-12-29 DIAGNOSIS — J45909 Unspecified asthma, uncomplicated: Secondary | ICD-10-CM | POA: Insufficient documentation

## 2020-12-30 DIAGNOSIS — J4550 Severe persistent asthma, uncomplicated: Secondary | ICD-10-CM

## 2020-12-31 ENCOUNTER — Other Ambulatory Visit: Payer: Self-pay

## 2020-12-31 ENCOUNTER — Ambulatory Visit (INDEPENDENT_AMBULATORY_CARE_PROVIDER_SITE_OTHER): Payer: Medicare Other | Admitting: *Deleted

## 2020-12-31 DIAGNOSIS — J4550 Severe persistent asthma, uncomplicated: Secondary | ICD-10-CM | POA: Diagnosis not present

## 2021-01-01 ENCOUNTER — Ambulatory Visit (INDEPENDENT_AMBULATORY_CARE_PROVIDER_SITE_OTHER): Payer: Medicare Other | Admitting: Cardiology

## 2021-01-01 ENCOUNTER — Encounter: Payer: Self-pay | Admitting: Cardiology

## 2021-01-01 VITALS — BP 130/76 | HR 82 | Ht 73.0 in | Wt 215.0 lb

## 2021-01-01 DIAGNOSIS — R002 Palpitations: Secondary | ICD-10-CM | POA: Diagnosis not present

## 2021-01-01 DIAGNOSIS — E785 Hyperlipidemia, unspecified: Secondary | ICD-10-CM | POA: Diagnosis not present

## 2021-01-01 DIAGNOSIS — I10 Essential (primary) hypertension: Secondary | ICD-10-CM | POA: Diagnosis not present

## 2021-01-01 MED ORDER — ROSUVASTATIN CALCIUM 10 MG PO TABS: 10.0000 mg | ORAL_TABLET | Freq: Every day | ORAL | 1 refills | Status: DC

## 2021-01-01 NOTE — Progress Notes (Signed)
Cardiology Office Note:    Date:  01/01/2021   ID:  Kyle [name], DOB Oct 05, 1950, MRN [id number]  PCP:  Nicoletta Dress, MD  Cardiologist:  Jenne Campus, MD    Referring MD: Nicoletta Dress, MD   Chief Complaint  Patient presents with  . Follow-up  I am doing well  History of Present Illness:    Kyle [name] is a 70 y.o. male with past medical history significant for palpitations, however work-up was unrevealing show only some nonsustained supraventricular tachycardia that he does not want to treat, essential hypertension, dyslipidemia with difficulty tolerating some medications.  He comes today 2 months of follow-up overall doing very well.  He walks on the regular basis 3 times a week he does have a chronic knee problem and required some ice packing after he finished working but overall seems to be doing well.  No chest pain tightness squeezing pressure burning chest no palpitations no dizziness no swelling of lower extremities  Past Medical History:  Diagnosis Date  . Allergic rhinoconjunctivitis 08/30/2017  . Asthma   . Chronic pansinusitis 06/08/2017  . Cluster headache   . Complication of anesthesia 1990   Difficult to arouse and bronchospasm  . COVID 09/2020  . Dyspnea on exertion 03/03/2020  . Essential hypertension 03/03/2020  . Eustachian tube dysfunction, right 10/13/2018  . Headache 07/11/2017  . LPRD (laryngopharyngeal reflux disease) 08/30/2017  . Middle ear effusion, right 10/13/2018  . Palpitations 03/03/2020  . Paresthesia 07/11/2017  . Primary osteoarthritis of left hip 02/13/2019  . Prostate cancer ([place]) 2020  . Reflux   . Sinusitis     Past Surgical History:  Procedure Laterality Date  . bone spur removed    . KNEE ARTHROPLASTY     x 2  . SINOSCOPY     6 SURGERIES  . tear duct replacement    . TOTAL HIP ARTHROPLASTY Left 02/13/2019   Procedure: TOTAL HIP ARTHROPLASTY ANTERIOR APPROACH;  Surgeon: Melrose Nakayama, MD;  Location:  [facility];  Service: Orthopedics;  Laterality: Left;    Current Medications: Current Meds  Medication Sig  . albuterol (PROVENTIL) (2.5 MG/3ML) 0.083% nebulizer solution Take 3 mLs (2.5 mg total) by nebulization every 6 (six) hours as needed for wheezing or shortness of breath.  Marland Kitchen albuterol (VENTOLIN HFA) 108 (90 Base) MCG/ACT inhaler Inhale 2 puffs into the lungs every 4 (four) hours as needed for wheezing or shortness of breath.  . ALPRAZolam (XANAX) 0.5 MG tablet Take 0.5 mg by mouth as needed for anxiety or sleep.  . Ascorbic Acid (VITAMIN C) 1000 MG tablet Take 1,000 mg by mouth daily.  Derrill Memo ON 06/18/2021] beclomethasone (QVAR REDIHALER) 80 MCG/ACT inhaler USE 2 INHALATIONS ORALLY   TWICE DAILY DURING FLARE-UPAS DIRECTED . RINSE, GARGLEAND SPIT AFTER USE (Patient taking differently: Inhale 2 puffs into the lungs as needed (sob and wheezing). USE 2 INHALATIONS ORALLY   TWICE DAILY DURING FLARE-UPAS DIRECTED . RINSE, GARGLEAND SPIT AFTER USE)  . budesonide-formoterol (SYMBICORT) 160-4.5 MCG/ACT inhaler 2 inhalations 1-2 times per day (Patient taking differently: Inhale 2 puffs into the lungs 2 (two) times daily. 2 inhalations 1-2 times per day)  . Carboxymethylcellul-Glycerin (LUBRICATING EYE DROPS OP) Place 1 drop into both eyes daily as needed (irritation).  Marland Kitchen EPINEPHrine 0.3 mg/0.3 mL IJ SOAJ injection Use as directed for life-threatening allergic reaction. (Patient taking differently: Inject 0.3 mg into the muscle as needed for anaphylaxis. Use as directed for life-threatening allergic reaction.)  . esomeprazole (  NEXIUM) 40 MG capsule TAKE 1 CAPSULE DAILY. CAN  INCREASE TO TWO TIMES A DAYDURING FLARE-UP (Patient taking differently: Take 40 mg by mouth as needed (Indigestion). TAKE 1 CAPSULE DAILY. CAN  INCREASE TO TWO TIMES A DAYDURING FLARE-UP)  . GARLIC PO Take 1 tablet by mouth daily. Unknown strength  . hydrOXYzine (VISTARIL) 25 MG capsule Take 1 capsule by mouth daily as needed Tiffany Kocher  urination).  . Melatonin 5 MG CAPS Take 5 mg by mouth at bedtime as needed (sleep).  . Misc Natural Products (TURMERIC CURCUMIN) CAPS Take 1 capsule by mouth daily. Unknown strength  . mometasone (NASONEX) 50 MCG/ACT nasal spray Use 1-2 sprays in each nostril once daily. (Patient taking differently: Place 1 spray into the nose 2 (two) times a day.)  . Multiple Vitamins-Minerals (MULTIVITAMIN PO) Take 1 tablet by mouth daily. Unknown strength  . Multiple Vitamins-Minerals (ZINC PO) Take 1 tablet by mouth daily. 50mg    Current Facility-Administered Medications for the 01/01/21 encounter (Office Visit) with Park Liter, MD  Medication  . Benralizumab SOSY 30 mg     Allergies:   Atorvastatin, Augmentin [amoxicillin-pot clavulanate], and Levaquin [levofloxacin in d5w]   Social History   Socioeconomic History  . Marital status: Married    Spouse name: Not on file  . Number of children: 2  . Years of education: Masters  . Highest education level: Not on file  Occupational History  . Occupation: Production assistant, radio    Comment: Retired Forensic psychologist  Tobacco Use  . Smoking status: Never Smoker  . Smokeless tobacco: Never Used  Vaping Use  . Vaping Use: Never used  Substance and Sexual Activity  . Alcohol use: No  . Drug use: No  . Sexual activity: Not Currently  Other Topics Concern  . Not on file  Social History Narrative   Lives at home with his wife.   Right-handed.   2.5 - 3 cups coffee per day.   Social Determinants of Health   Financial Resource Strain: Not on file  Food Insecurity: Not on file  Transportation Needs: Not on file  Physical Activity: Not on file  Stress: Not on file  Social Connections: Not on file     Family History: The patient's family history includes Asthma in his father; Emphysema in his mother; Heart attack in his mother; Stroke in his father. ROS:   Please see the history of present illness.    All 14 point review of systems negative  except as described per history of present illness  EKGs/Labs/Other Studies Reviewed:      Recent Labs: 11/19/2020: ALT 20  Recent Lipid Panel    Component Value Date/Time   CHOL 167 11/19/2020 0907   TRIG 50 11/19/2020 0907   HDL 63 11/19/2020 0907   CHOLHDL 2.7 11/19/2020 0907   LDLCALC 94 11/19/2020 0907    Physical Exam:    VS:  BP 130/76 (BP Location: Right Arm, Patient Position: Sitting)   Pulse 82   Ht 6\' 1"  (1.854 m)   Wt 215 lb (97.5 kg)   SpO2 97%   BMI 28.37 kg/m     Wt Readings from Last 3 Encounters:  01/01/21 215 lb (97.5 kg)  10/13/20 211 lb 9.6 oz (96 kg)  07/22/20 215 lb (97.5 kg)     GEN:  Well nourished, well developed in no acute distress HEENT: Normal NECK: No JVD; No carotid bruits LYMPHATICS: No lymphadenopathy CARDIAC: RRR, no murmurs, no rubs, no gallops RESPIRATORY:  Clear to  auscultation without rales, wheezing or rhonchi  ABDOMEN: Soft, non-tender, non-distended MUSCULOSKELETAL:  No edema; No deformity  SKIN: Warm and dry LOWER EXTREMITIES: no swelling NEUROLOGIC:  Alert and oriented x 3 PSYCHIATRIC:  Normal affect   ASSESSMENT:    1. Palpitations   2. Dyslipidemia   3. Essential hypertension    PLAN:    In order of problems listed above:  1. Palpitations.  Doing well denies having any. 2. Dyslipidemia he was taking Lipitor 10 and had excellent cholesterol profile K PN show me his LDL of 94 HDL 63 however stop Lipitor about 3 weeks ago because of some joint aches.  He is still willing to try different statin I still insist on putting him on high intensity statin we will start with Crestor 10 mg daily and see how he can tolerate it if he felt that medication then would go to pravastatin. 3. Essential hypertension blood pressure is well controlled continue present management. 4. We did talk about healthy lifestyle need to exercise on the regular basis and good diet which she does already.  See him back in about 6 months cholesterol  will be rechecked in 6 weeks.   Medication Adjustments/Labs and Tests Ordered: Current medicines are reviewed at length with the patient today.  Concerns regarding medicines are outlined above.  No orders of the defined types were placed in this encounter.  Medication changes: No orders of the defined types were placed in this encounter.   Signed, Park Liter, MD, [facility] 01/01/2021 8:28 AM    [place]

## 2021-01-01 NOTE — Addendum Note (Signed)
Addended by: Senaida Ores on: 01/01/2021 08:40 AM   Modules accepted: Orders

## 2021-01-01 NOTE — Patient Instructions (Signed)
Medication Instructions:  Your physician has recommended you make the following change in your medication:  START: Crestor 10 mg daily   *If you need a refill on your cardiac medications before your next appointment, please call your pharmacy*   Lab Work: Your physician recommends that you return for lab work in 6 weeks: lipid, lft  If you have labs (blood work) drawn today and your tests are completely normal, you will receive your results only by: Marland Kitchen MyChart Message (if you have MyChart) OR . A paper copy in the mail If you have any lab test that is abnormal or we need to change your treatment, we will call you to review the results.   Testing/Procedures: None   Follow-Up: At [facility], you and your health needs are our priority.  As part of our continuing mission to provide you with exceptional heart care, we have created designated Provider Care Teams.  These Care Teams include your primary Cardiologist (physician) and Advanced Practice Providers (APPs -  Physician Assistants and Nurse Practitioners) who all work together to provide you with the care you need, when you need it.  We recommend signing up for the patient portal called "MyChart".  Sign up information is provided on this After Visit Summary.  MyChart is used to connect with patients for Virtual Visits (Telemedicine).  Patients are able to view lab/test results, encounter notes, upcoming appointments, etc.  Non-urgent messages can be sent to your provider as well.   To learn more about what you can do with MyChart, go to [email or web address].    Your next appointment:   6 month(s)  The format for your next appointment:   In Person  Provider:   Jenne Campus, MD   Other Instructions  Rosuvastatin Tablets What is this medicine? ROSUVASTATIN (roe SOO va sta tin) is known as a HMG-CoA reductase inhibitor or 'statin'. It lowers cholesterol and triglycerides in the blood. This drug may also reduce the risk  of heart attack, stroke, or other health problems in patients with risk factors for heart disease. Diet and lifestyle changes are often used with this drug. This medicine may be used for other purposes; ask your health care provider or pharmacist if you have questions. COMMON BRAND NAME(S): Crestor What should I tell my health care provider before I take this medicine? They need to know if you have any of these conditions:  diabetes  if you often drink alcohol  history of stroke  kidney disease  liver disease  muscle aches or weakness  thyroid disease  an unusual or allergic reaction to rosuvastatin, other medicines, foods, dyes, or preservatives  pregnant or trying to get pregnant  breast-feeding How should I use this medicine? Take this medicine by mouth with a glass of water. Follow the directions on the prescription label. Do not cut, crush or chew this medicine. You can take this medicine with or without food. Take your doses at regular intervals. Do not take your medicine more often than directed. Talk to your pediatrician regarding the use of this medicine in children. While this drug may be prescribed for children as young as 57 years old for selected conditions, precautions do apply. Overdosage: If you think you have taken too much of this medicine contact a poison control center or emergency room at once. NOTE: This medicine is only for you. Do not share this medicine with others. What if I miss a dose? If you miss a dose, take it as soon as you  can. If your next dose is to be taken in less than 12 hours, then do not take the missed dose. Take the next dose at your regular time. Do not take double or extra doses. What may interact with this medicine? Do not take this medicine with any of the following medications:  herbal medicines like red yeast rice This medicine may also interact with the following medications:  alcohol  antacids containing aluminum hydroxide or  magnesium hydroxide  cyclosporine  other medicines for high cholesterol  some medicines for HIV infection  warfarin This list may not describe all possible interactions. Give your health care provider a list of all the medicines, herbs, non-prescription drugs, or dietary supplements you use. Also tell them if you smoke, drink alcohol, or use illegal drugs. Some items may interact with your medicine. What should I watch for while using this medicine? Visit your doctor or health care professional for regular check-ups. You may need regular tests to make sure your liver is working properly. Your health care professional may tell you to stop taking this medicine if you develop muscle problems. If your muscle problems do not go away after stopping this medicine, contact your health care professional. Do not become pregnant while taking this medicine. Women should inform their health care professional if they wish to become pregnant or think they might be pregnant. There is a potential for serious side effects to an unborn child. Talk to your health care professional or pharmacist for more information. Do not breast-feed an infant while taking this medicine. This medicine may increase blood sugar. Ask your healthcare provider if changes in diet or medicines are needed if you have diabetes. If you are going to need surgery or other procedure, tell your doctor that you are using this medicine. This drug is only part of a total heart-health program. Your doctor or a dietician can suggest a low-cholesterol and low-fat diet to help. Avoid alcohol and smoking, and keep a proper exercise schedule. This medicine may cause a decrease in Co-Enzyme Q-10. You should make sure that you get enough Co-Enzyme Q-10 while you are taking this medicine. Discuss the foods you eat and the vitamins you take with your health care professional. What side effects may I notice from receiving this medicine? Side effects that you  should report to your doctor or health care professional as soon as possible:  allergic reactions like skin rash, itching or hives, swelling of the face, lips, or tongue  confusion  joint pain  loss of memory  redness, blistering, peeling or loosening of the skin, including inside the mouth  signs and symptoms of high blood sugar such as being more thirsty or hungry or having to urinate more than normal. You may also feel very tired or have blurry vision.  signs and symptoms of muscle injury like dark urine; trouble passing urine or change in the amount of urine; unusually weak or tired; muscle pain or side or back pain  yellowing of the eyes or skin Side effects that usually do not require medical attention (report to your doctor or health care professional if they continue or are bothersome):  constipation  diarrhea  dizziness  gas  headache  nausea  stomach pain  trouble sleeping  upset stomach This list may not describe all possible side effects. Call your doctor for medical advice about side effects. You may report side effects to FDA at 1-800-FDA-1088. Where should I keep my medicine? Keep out of the reach  of children and pets. Store between 20 and 25 degrees C (68 and 77 degrees F). Get rid of any unused medicine after the expiration date. To get rid of medicines that are no longer needed or have expired:  Take the medicine to a medicine take-back program. Check with your pharmacy or law enforcement to find a location.  If you cannot return the medicine, check the label or package insert to see if the medicine should be thrown out in the garbage or flushed down the toilet. If you are not sure, ask your health care provider. If it is safe to put it in the trash, take the medicine out of the container. Mix the medicine with cat litter, dirt, coffee grounds, or other unwanted substance. Seal the mixture in a bag or container. Put it in the trash. NOTE: This sheet is a  summary. It may not cover all possible information. If you have questions about this medicine, talk to your doctor, pharmacist, or health care provider.  2021 Elsevier/Gold Standard (2020-07-13 09:55:07)

## 2021-01-20 DIAGNOSIS — L219 Seborrheic dermatitis, unspecified: Secondary | ICD-10-CM | POA: Diagnosis not present

## 2021-01-20 DIAGNOSIS — L300 Nummular dermatitis: Secondary | ICD-10-CM | POA: Diagnosis not present

## 2021-01-20 IMAGING — DX CHEST - 2 VIEW
2 series · 2 of 2 positions shown · non-contrast
Comparison: 02/17/2012

CLINICAL DATA: Prostate carcinoma.  Pre-op respiratory exam

EXAM:
CHEST - 2 VIEW

[chest pa]
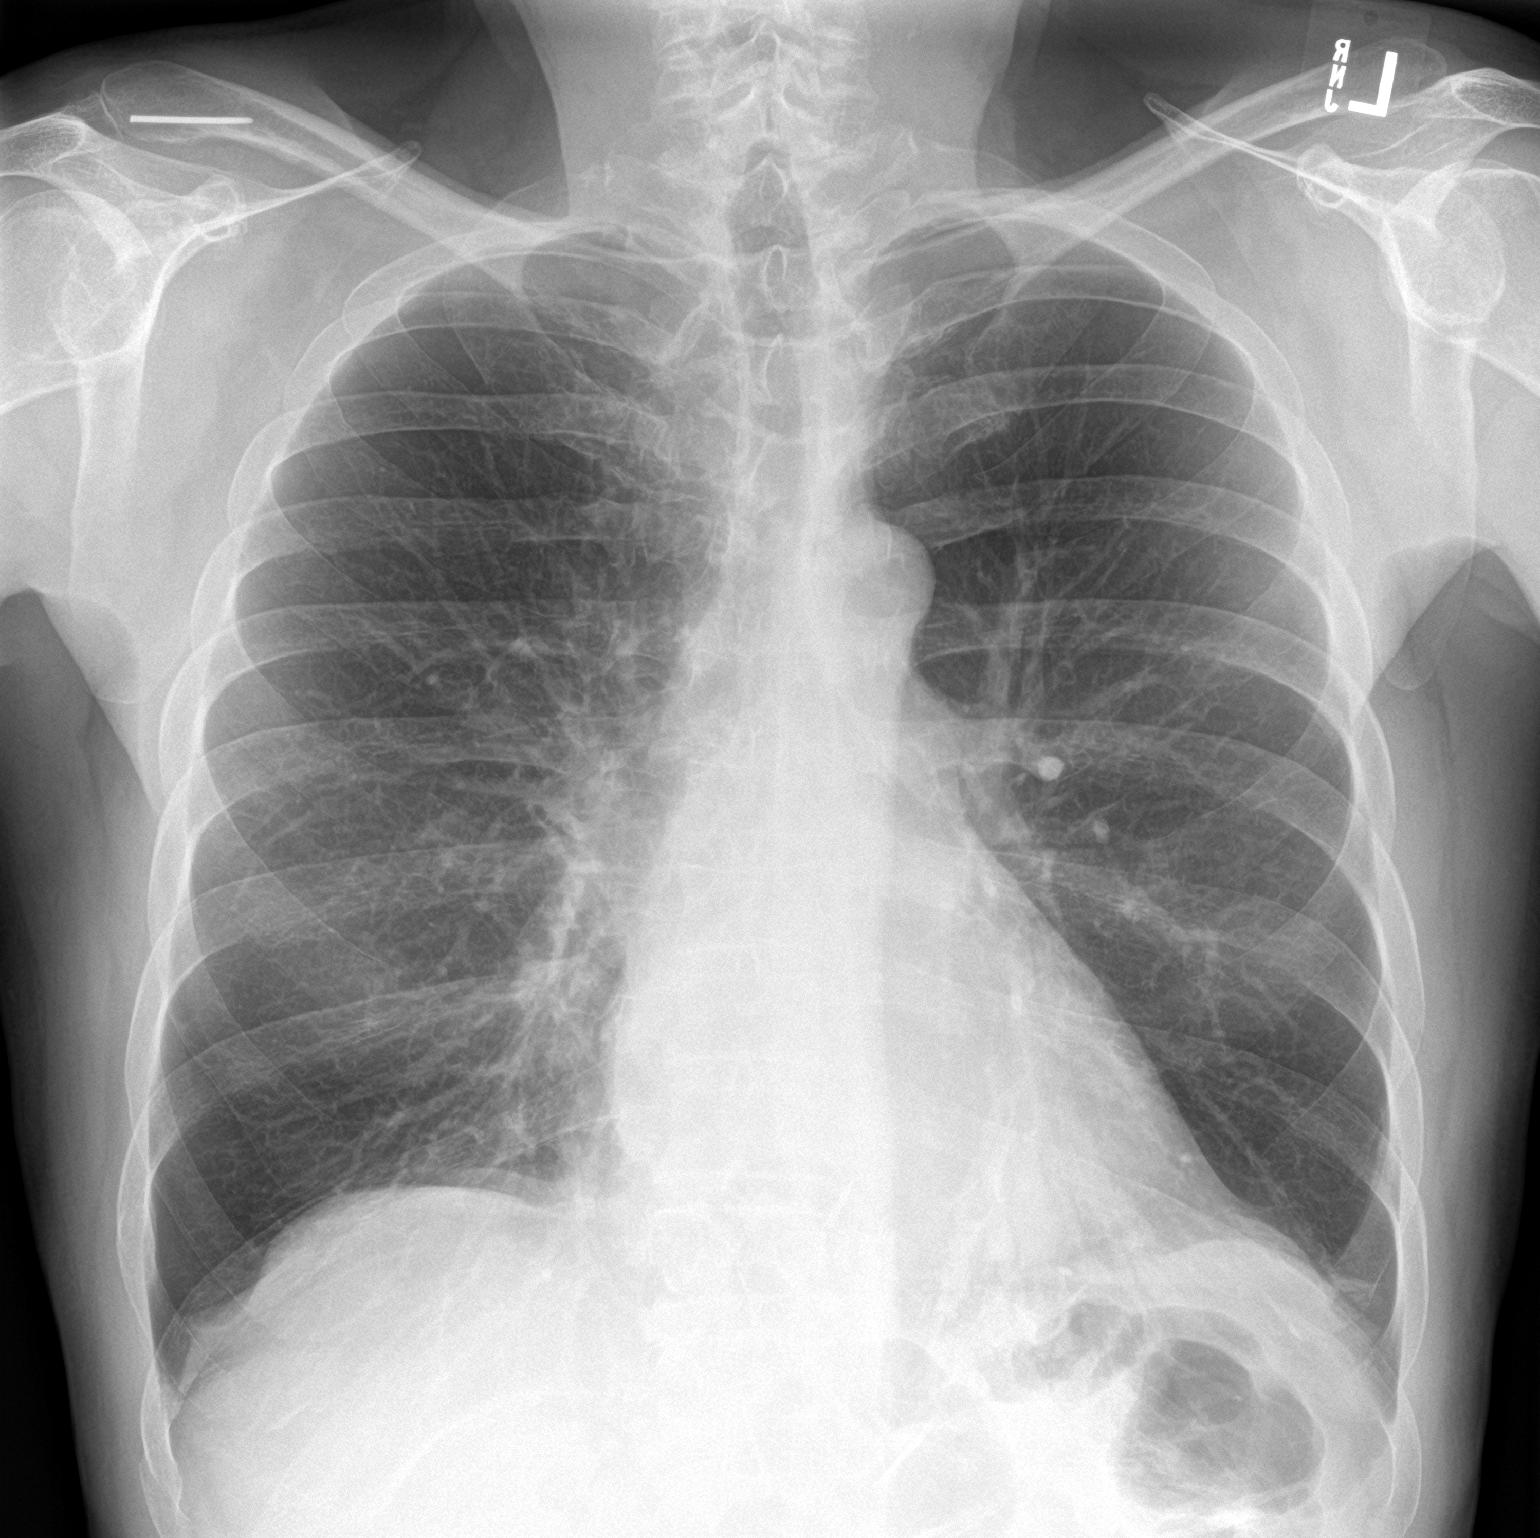

[chest lat]
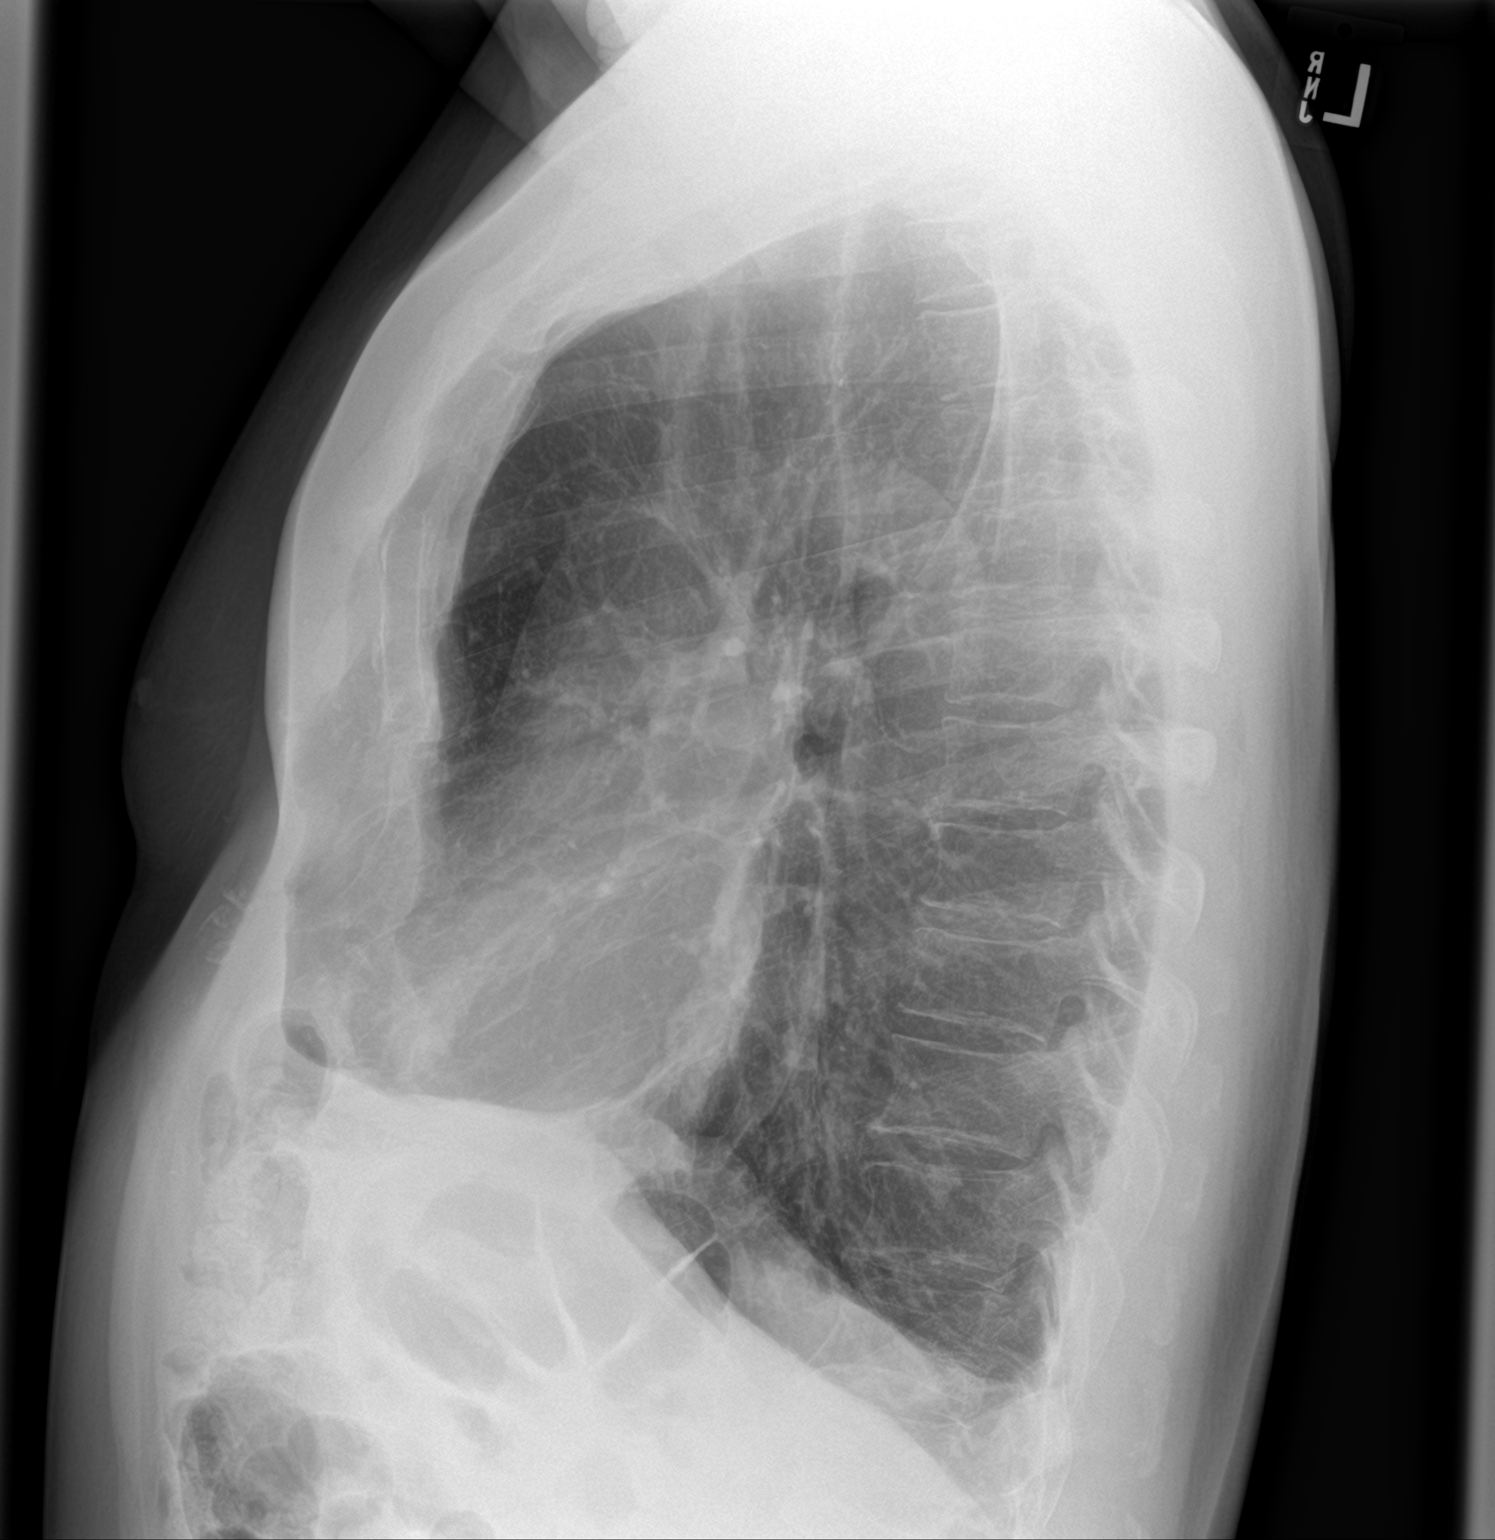

[2 of 2 positions shown; findings below may reference images not displayed]

FINDINGS: The heart size and mediastinal contours are within normal limits.
Pulmonary hyperinflation is again seen, consistent with COPD.
Branching opacity is again seen retrocardiac left lower lobe,
without change since previous study. This consistent with a chronic
bronchocele. No evidence of acute infiltrate or edema. No evidence
of pleural effusion.
IMPRESSION: 1. Stable COPD and chronic left lower lobe bronchocele.
2. No active cardiopulmonary disease.

## 2021-02-18 DIAGNOSIS — C61 Malignant neoplasm of prostate: Secondary | ICD-10-CM | POA: Diagnosis not present

## 2021-02-18 DIAGNOSIS — R351 Nocturia: Secondary | ICD-10-CM | POA: Diagnosis not present

## 2021-02-24 DIAGNOSIS — J4550 Severe persistent asthma, uncomplicated: Secondary | ICD-10-CM | POA: Diagnosis not present

## 2021-02-25 ENCOUNTER — Other Ambulatory Visit: Payer: Self-pay

## 2021-02-25 ENCOUNTER — Ambulatory Visit (INDEPENDENT_AMBULATORY_CARE_PROVIDER_SITE_OTHER): Payer: Medicare Other | Admitting: *Deleted

## 2021-02-25 DIAGNOSIS — J4550 Severe persistent asthma, uncomplicated: Secondary | ICD-10-CM | POA: Diagnosis not present

## 2021-03-12 DIAGNOSIS — L820 Inflamed seborrheic keratosis: Secondary | ICD-10-CM | POA: Diagnosis not present

## 2021-03-12 DIAGNOSIS — D485 Neoplasm of uncertain behavior of skin: Secondary | ICD-10-CM | POA: Diagnosis not present

## 2021-04-08 ENCOUNTER — Other Ambulatory Visit: Payer: Self-pay | Admitting: Allergy and Immunology

## 2021-04-21 DIAGNOSIS — J4550 Severe persistent asthma, uncomplicated: Secondary | ICD-10-CM | POA: Diagnosis not present

## 2021-04-22 ENCOUNTER — Ambulatory Visit (INDEPENDENT_AMBULATORY_CARE_PROVIDER_SITE_OTHER): Payer: Medicare Other | Admitting: *Deleted

## 2021-04-22 ENCOUNTER — Other Ambulatory Visit: Payer: Self-pay

## 2021-04-22 DIAGNOSIS — J4550 Severe persistent asthma, uncomplicated: Secondary | ICD-10-CM

## 2021-05-04 DIAGNOSIS — L820 Inflamed seborrheic keratosis: Secondary | ICD-10-CM | POA: Diagnosis not present

## 2021-05-04 DIAGNOSIS — L578 Other skin changes due to chronic exposure to nonionizing radiation: Secondary | ICD-10-CM | POA: Diagnosis not present

## 2021-05-04 DIAGNOSIS — L821 Other seborrheic keratosis: Secondary | ICD-10-CM | POA: Diagnosis not present

## 2021-05-17 ENCOUNTER — Other Ambulatory Visit: Payer: Self-pay | Admitting: Allergy and Immunology

## 2021-06-16 DIAGNOSIS — J4550 Severe persistent asthma, uncomplicated: Secondary | ICD-10-CM | POA: Diagnosis not present

## 2021-06-17 ENCOUNTER — Ambulatory Visit (INDEPENDENT_AMBULATORY_CARE_PROVIDER_SITE_OTHER): Payer: Medicare Other | Admitting: *Deleted

## 2021-06-17 ENCOUNTER — Other Ambulatory Visit: Payer: Self-pay

## 2021-06-17 DIAGNOSIS — J4550 Severe persistent asthma, uncomplicated: Secondary | ICD-10-CM

## 2021-07-17 DIAGNOSIS — Z23 Encounter for immunization: Secondary | ICD-10-CM | POA: Diagnosis not present

## 2021-07-23 DIAGNOSIS — Z20822 Contact with and (suspected) exposure to covid-19: Secondary | ICD-10-CM | POA: Diagnosis not present

## 2021-07-27 DIAGNOSIS — S76311A Strain of muscle, fascia and tendon of the posterior muscle group at thigh level, right thigh, initial encounter: Secondary | ICD-10-CM | POA: Diagnosis not present

## 2021-07-31 DIAGNOSIS — M62551 Muscle wasting and atrophy, not elsewhere classified, right thigh: Secondary | ICD-10-CM | POA: Diagnosis not present

## 2021-07-31 DIAGNOSIS — R2689 Other abnormalities of gait and mobility: Secondary | ICD-10-CM | POA: Diagnosis not present

## 2021-07-31 DIAGNOSIS — M79651 Pain in right thigh: Secondary | ICD-10-CM | POA: Diagnosis not present

## 2021-08-10 DIAGNOSIS — M62551 Muscle wasting and atrophy, not elsewhere classified, right thigh: Secondary | ICD-10-CM | POA: Diagnosis not present

## 2021-08-10 DIAGNOSIS — R2689 Other abnormalities of gait and mobility: Secondary | ICD-10-CM | POA: Diagnosis not present

## 2021-08-10 DIAGNOSIS — J4550 Severe persistent asthma, uncomplicated: Secondary | ICD-10-CM | POA: Diagnosis not present

## 2021-08-10 DIAGNOSIS — M79651 Pain in right thigh: Secondary | ICD-10-CM | POA: Diagnosis not present

## 2021-08-11 ENCOUNTER — Other Ambulatory Visit: Payer: Self-pay

## 2021-08-11 ENCOUNTER — Ambulatory Visit (INDEPENDENT_AMBULATORY_CARE_PROVIDER_SITE_OTHER): Payer: Medicare Other

## 2021-08-11 DIAGNOSIS — J4550 Severe persistent asthma, uncomplicated: Secondary | ICD-10-CM | POA: Diagnosis not present

## 2021-08-12 ENCOUNTER — Ambulatory Visit: Payer: Medicare Other

## 2021-08-17 DIAGNOSIS — M79651 Pain in right thigh: Secondary | ICD-10-CM | POA: Diagnosis not present

## 2021-08-17 DIAGNOSIS — R2689 Other abnormalities of gait and mobility: Secondary | ICD-10-CM | POA: Diagnosis not present

## 2021-08-17 DIAGNOSIS — M62551 Muscle wasting and atrophy, not elsewhere classified, right thigh: Secondary | ICD-10-CM | POA: Diagnosis not present

## 2021-08-25 DIAGNOSIS — C61 Malignant neoplasm of prostate: Secondary | ICD-10-CM | POA: Diagnosis not present

## 2021-08-25 DIAGNOSIS — R351 Nocturia: Secondary | ICD-10-CM | POA: Diagnosis not present

## 2021-08-31 DIAGNOSIS — M79651 Pain in right thigh: Secondary | ICD-10-CM | POA: Diagnosis not present

## 2021-08-31 DIAGNOSIS — R2689 Other abnormalities of gait and mobility: Secondary | ICD-10-CM | POA: Diagnosis not present

## 2021-08-31 DIAGNOSIS — M62551 Muscle wasting and atrophy, not elsewhere classified, right thigh: Secondary | ICD-10-CM | POA: Diagnosis not present

## 2021-09-04 DIAGNOSIS — H25813 Combined forms of age-related cataract, bilateral: Secondary | ICD-10-CM | POA: Diagnosis not present

## 2021-09-10 DIAGNOSIS — E785 Hyperlipidemia, unspecified: Secondary | ICD-10-CM | POA: Diagnosis not present

## 2021-09-10 LAB — BASIC METABOLIC PANEL
BUN/Creatinine Ratio: 17 (ref 10–24)
BUN: 19 mg/dL (ref 8–27)
CO2: 23 mmol/L (ref 20–29)
Calcium: 9 mg/dL (ref 8.6–10.2)
Chloride: 104 mmol/L (ref 96–106)
Creatinine, Ser: 1.14 mg/dL (ref 0.76–1.27)
Glucose: 100 mg/dL — ABNORMAL HIGH (ref 70–99)
Potassium: 4.6 mmol/L (ref 3.5–5.2)
Sodium: 139 mmol/L (ref 134–144)
eGFR: 69 mL/min/{1.73_m2} (ref >59)

## 2021-09-10 LAB — HEPATIC FUNCTION PANEL
ALT: 19 IU/L (ref 0–44)
AST: 23 IU/L (ref 0–40)
Albumin: 4.3 g/dL (ref 3.7–4.7)
Alkaline Phosphatase: 61 IU/L (ref 44–121)
Bilirubin Total: 0.6 mg/dL (ref 0.0–1.2)
Bilirubin, Direct: 0.14 mg/dL (ref 0.00–0.40)
Total Protein: 5.9 g/dL — ABNORMAL LOW (ref 6.0–8.5)

## 2021-09-10 LAB — LIPID PANEL
Chol/HDL Ratio: 3.3 ratio (ref 0.0–5.0)
Cholesterol, Total: 205 mg/dL — ABNORMAL HIGH (ref 100–199)
HDL: 63 mg/dL (ref >39)
LDL Chol Calc (NIH): 134 mg/dL — ABNORMAL HIGH (ref 0–99)
Triglycerides: 45 mg/dL (ref 0–149)
VLDL Cholesterol Cal: 8 mg/dL (ref 5–40)

## 2021-09-10 NOTE — Addendum Note (Signed)
Addended by: Truddie Hidden on: 09/10/2021 09:54 AM   Modules accepted: Orders

## 2021-09-11 MED ORDER — PRAVASTATIN SODIUM 40 MG PO TABS: 40.0000 mg | ORAL_TABLET | Freq: Every evening | ORAL | 12 refills | Status: DC

## 2021-09-11 NOTE — Addendum Note (Signed)
Addended by: Truddie Hidden on: 09/11/2021 04:06 PM   Modules accepted: Orders

## 2021-09-16 ENCOUNTER — Encounter: Payer: Self-pay | Admitting: Cardiology

## 2021-09-16 ENCOUNTER — Ambulatory Visit (INDEPENDENT_AMBULATORY_CARE_PROVIDER_SITE_OTHER): Payer: Medicare Other | Admitting: Cardiology

## 2021-09-16 ENCOUNTER — Other Ambulatory Visit: Payer: Self-pay

## 2021-09-16 VITALS — BP 128/74 | HR 76 | Ht 72.0 in | Wt 205.2 lb

## 2021-09-16 DIAGNOSIS — I10 Essential (primary) hypertension: Secondary | ICD-10-CM

## 2021-09-16 DIAGNOSIS — E785 Hyperlipidemia, unspecified: Secondary | ICD-10-CM

## 2021-09-16 DIAGNOSIS — R002 Palpitations: Secondary | ICD-10-CM

## 2021-09-16 NOTE — Addendum Note (Signed)
Addended by: Edwyna Shell I on: 09/16/2021 08:57 AM   Modules accepted: Orders

## 2021-09-16 NOTE — Patient Instructions (Signed)
Medication Instructions:  Your physician recommends that you continue on your current medications as directed. Please refer to the Current Medication list given to you today.  *If you need a refill on your cardiac medications before your next appointment, please call your pharmacy*   Lab Work: Your physician recommends that you return for lab work in: 6 weeks fasting:lipid, lft If you have labs (blood work) drawn today and your tests are completely normal, you will receive your results only by: [place] (if you have MyChart) OR A paper copy in the mail If you have any lab test that is abnormal or we need to change your treatment, we will call you to review the results.   Testing/Procedures: none   Follow-Up: At [facility], you and your health needs are our priority.  As part of our continuing mission to provide you with exceptional heart care, we have created designated Provider Care Teams.  These Care Teams include your primary Cardiologist (physician) and Advanced Practice Providers (APPs -  Physician Assistants and Nurse Practitioners) who all work together to provide you with the care you need, when you need it.  We recommend signing up for the patient portal called "MyChart".  Sign up information is provided on this After Visit Summary.  MyChart is used to connect with patients for Virtual Visits (Telemedicine).  Patients are able to view lab/test results, encounter notes, upcoming appointments, etc.  Non-urgent messages can be sent to your provider as well.   To learn more about what you can do with MyChart, go to [email or web address].    Your next appointment:   1 year(s)  The format for your next appointment:   In Person  Provider:   Jenne Campus, MD    Other Instructions

## 2021-09-16 NOTE — Progress Notes (Signed)
Cardiology Office Note:    Date:  09/16/2021   ID:  Kyle [name], DOB October 29, 1950, MRN [id number]  PCP:  Nicoletta Dress, MD  Cardiologist:  Jenne Campus, MD    Referring MD: Nicoletta Dress, MD   Chief Complaint  Patient presents with   Follow-up  I am doing fine  History of Present Illness:    Kyle [name] is a 71 y.o. male  with past medical history significant for palpitations, however work-up was unrevealing show only some nonsustained supraventricular tachycardia that he does not want to treat, essential hypertension, dyslipidemia with difficulty tolerating some medications He comes today to my office for follow-up.  Overall he seems to be doing well.  Still very active play a lot of games.  Trying to walk on the regular basis he did pull out right hamstring again for 3 months he was unable to walk as much as normal due to but started building up his stamina again back to his routine.  Denies have any chest pain tightness squeezing pressure burning chest no palpitations no dizziness overall stable.  He does have difficulty tolerating statin yesterday he went back on different statin now he is on pravastatin we will see how things will go.  Past Medical History:  Diagnosis Date   Allergic rhinoconjunctivitis 08/30/2017   Asthma    Chronic pansinusitis 06/08/2017   Cluster headache    Complication of anesthesia 1990   Difficult to arouse and bronchospasm   COVID 09/2020   Dyspnea on exertion 03/03/2020   Essential hypertension 03/03/2020   Eustachian tube dysfunction, right 10/13/2018   Headache 07/11/2017   LPRD (laryngopharyngeal reflux disease) 08/30/2017   Middle ear effusion, right 10/13/2018   Palpitations 03/03/2020   Paresthesia 07/11/2017   Primary osteoarthritis of left hip 02/13/2019   Prostate cancer ([place]) 2020   Reflux    Sinusitis     Past Surgical History:  Procedure Laterality Date   bone spur removed     KNEE ARTHROPLASTY     x 2    SINOSCOPY     6 SURGERIES   tear duct replacement     TOTAL HIP ARTHROPLASTY Left 02/13/2019   Procedure: TOTAL HIP ARTHROPLASTY ANTERIOR APPROACH;  Surgeon: Melrose Nakayama, MD;  Location: [facility];  Service: Orthopedics;  Laterality: Left;    Current Medications: Current Meds  Medication Sig   albuterol (VENTOLIN HFA) 108 (90 Base) MCG/ACT inhaler Inhale 2 puffs into the lungs every 4 (four) hours as needed for wheezing or shortness of breath.   ALPRAZolam (XANAX) 0.5 MG tablet Take 0.5 mg by mouth as needed for anxiety or sleep.   Ascorbic Acid (VITAMIN C) 1000 MG tablet Take 1,000 mg by mouth daily.   beclomethasone (QVAR) 80 MCG/ACT inhaler Inhale 2 puffs into the lungs 2 (two) times daily.   budesonide-formoterol (SYMBICORT) 160-4.5 MCG/ACT inhaler Inhale 2 puffs into the lungs 2 (two) times daily.   Carboxymethylcellul-Glycerin (LUBRICATING EYE DROPS OP) Place 1 drop into both eyes daily as needed (irritation).   esomeprazole (NEXIUM) 40 MG capsule TAKE 1 CAPSULE DAILY. CAN  INCREASE TO TWO TIMES A DAYDURING FLARE-UP (Patient taking differently: Take 40 mg by mouth as needed (Indigestion). TAKE 1 CAPSULE DAILY. CAN  INCREASE TO TWO TIMES A DAYDURING FLARE-UP)   GARLIC PO Take 1 tablet by mouth daily. Unknown strength   hydrOXYzine (VISTARIL) 25 MG capsule Take 1 capsule by mouth daily as needed Tiffany Kocher urination).   Melatonin 5 MG CAPS Take  5 mg by mouth at bedtime as needed (sleep).   Misc Natural Products (TURMERIC CURCUMIN) CAPS Take 1 capsule by mouth daily. Unknown strength   mometasone (NASONEX) 50 MCG/ACT nasal spray Place 1-2 sprays into the nose daily.   Multiple Vitamins-Minerals (MULTIVITAMIN PO) Take 1 tablet by mouth daily. Unknown strength   Multiple Vitamins-Minerals (ZINC PO) Take 1 tablet by mouth daily. 50mg    pravastatin (PRAVACHOL) 40 MG tablet Take 1 tablet (40 mg total) by mouth every evening.   Current Facility-Administered Medications for the 09/16/21  encounter (Office Visit) with Park Liter, MD  Medication   Benralizumab SOSY 30 mg     Allergies:   Atorvastatin, Augmentin [amoxicillin-pot clavulanate], and Levaquin [levofloxacin in d5w]   Social History   Socioeconomic History   Marital status: Married    Spouse name: Not on file   Number of children: 2   Years of education: Masters   Highest education level: Not on file  Occupational History   Occupation: Production assistant, radio    Comment: Retired Forensic psychologist  Tobacco Use   Smoking status: Never   Smokeless tobacco: Never  Vaping Use   Vaping Use: Never used  Substance and Sexual Activity   Alcohol use: No   Drug use: No   Sexual activity: Not Currently  Other Topics Concern   Not on file  Social History Narrative   Lives at home with his wife.   Right-handed.   2.5 - 3 cups coffee per day.   Social Determinants of Health   Financial Resource Strain: Not on file  Food Insecurity: Not on file  Transportation Needs: Not on file  Physical Activity: Not on file  Stress: Not on file  Social Connections: Not on file     Family History: The patient's family history includes Asthma in his father; Emphysema in his mother; Heart attack in his mother; Stroke in his father. ROS:   Please see the history of present illness.    All 14 point review of systems negative except as described per history of present illness  EKGs/Labs/Other Studies Reviewed:      Recent Labs: 09/10/2021: ALT 19; BUN 19; Creatinine, Ser 1.14; Potassium 4.6; Sodium 139  Recent Lipid Panel    Component Value Date/Time   CHOL 205 (H) 09/10/2021 0957   TRIG 45 09/10/2021 0957   HDL 63 09/10/2021 0957   CHOLHDL 3.3 09/10/2021 0957   LDLCALC 134 (H) 09/10/2021 0957    Physical Exam:    VS:  BP 128/74 (BP Location: Left Arm, Patient Position: Sitting)    Pulse 76    Ht 6' (1.829 m)    Wt 205 lb 3.2 oz (93.1 kg)    SpO2 96%    BMI 27.83 kg/m     Wt Readings from Last 3 Encounters:   09/16/21 205 lb 3.2 oz (93.1 kg)  01/01/21 215 lb (97.5 kg)  10/13/20 211 lb 9.6 oz (96 kg)     GEN:  Well nourished, well developed in no acute distress HEENT: Normal NECK: No JVD; No carotid bruits LYMPHATICS: No lymphadenopathy CARDIAC: RRR, no murmurs, no rubs, no gallops RESPIRATORY:  Clear to auscultation without rales, wheezing or rhonchi  ABDOMEN: Soft, non-tender, non-distended MUSCULOSKELETAL:  No edema; No deformity  SKIN: Warm and dry LOWER EXTREMITIES: no swelling NEUROLOGIC:  Alert and oriented x 3 PSYCHIATRIC:  Normal affect   ASSESSMENT:    1. Essential hypertension   2. Dyslipidemia   3. Palpitations    PLAN:  In order of problems listed above:  Essential hypertension blood pressure well controlled continue present medications. Dyslipidemia started on pravastatin.  We will recheck his fasting lipid profile 3 months from now I do have data from 2022 March and K PN show LDL 94 HDL 63 but that was with medications.  We will recheck his fasting lipid profile with new medication. Palpitations/SVT.  Denies having any.   Medication Adjustments/Labs and Tests Ordered: Current medicines are reviewed at length with the patient today.  Concerns regarding medicines are outlined above.  No orders of the defined types were placed in this encounter.  Medication changes: No orders of the defined types were placed in this encounter.   Signed, Park Liter, MD, [facility] 09/16/2021 8:43 AM    [place]

## 2021-10-05 DIAGNOSIS — J4550 Severe persistent asthma, uncomplicated: Secondary | ICD-10-CM

## 2021-10-06 ENCOUNTER — Ambulatory Visit (INDEPENDENT_AMBULATORY_CARE_PROVIDER_SITE_OTHER): Payer: Medicare Other | Admitting: *Deleted

## 2021-10-06 ENCOUNTER — Other Ambulatory Visit: Payer: Self-pay

## 2021-10-06 DIAGNOSIS — J4550 Severe persistent asthma, uncomplicated: Secondary | ICD-10-CM | POA: Diagnosis not present

## 2021-10-08 ENCOUNTER — Ambulatory Visit (INDEPENDENT_AMBULATORY_CARE_PROVIDER_SITE_OTHER): Payer: Medicare Other | Admitting: Allergy and Immunology

## 2021-10-08 ENCOUNTER — Encounter: Payer: Self-pay | Admitting: Allergy and Immunology

## 2021-10-08 ENCOUNTER — Other Ambulatory Visit: Payer: Self-pay

## 2021-10-08 VITALS — BP 152/88 | HR 68 | Resp 10 | Ht 71.5 in | Wt 206.8 lb

## 2021-10-08 DIAGNOSIS — J4551 Severe persistent asthma with (acute) exacerbation: Secondary | ICD-10-CM

## 2021-10-08 DIAGNOSIS — J479 Bronchiectasis, uncomplicated: Secondary | ICD-10-CM | POA: Diagnosis not present

## 2021-10-08 DIAGNOSIS — J3089 Other allergic rhinitis: Secondary | ICD-10-CM

## 2021-10-08 DIAGNOSIS — B9789 Other viral agents as the cause of diseases classified elsewhere: Secondary | ICD-10-CM | POA: Diagnosis not present

## 2021-10-08 DIAGNOSIS — Z8709 Personal history of other diseases of the respiratory system: Secondary | ICD-10-CM | POA: Diagnosis not present

## 2021-10-08 DIAGNOSIS — J988 Other specified respiratory disorders: Secondary | ICD-10-CM

## 2021-10-08 DIAGNOSIS — K219 Gastro-esophageal reflux disease without esophagitis: Secondary | ICD-10-CM | POA: Diagnosis not present

## 2021-10-08 MED ORDER — ESOMEPRAZOLE MAGNESIUM 40 MG PO CPDR: DELAYED_RELEASE_CAPSULE | ORAL | 3 refills | Status: DC

## 2021-10-08 MED ORDER — BECLOMETHASONE DIPROP HFA 80 MCG/ACT IN AERB: INHALATION_SPRAY | RESPIRATORY_TRACT | 1 refills | Status: DC

## 2021-10-08 MED ORDER — BUDESONIDE-FORMOTEROL FUMARATE 160-4.5 MCG/ACT IN AERO: INHALATION_SPRAY | RESPIRATORY_TRACT | 3 refills | Status: DC

## 2021-10-08 MED ORDER — ALBUTEROL SULFATE HFA 108 (90 BASE) MCG/ACT IN AERS: INHALATION_SPRAY | RESPIRATORY_TRACT | 1 refills | Status: DC

## 2021-10-08 MED ORDER — MOMETASONE FUROATE 50 MCG/ACT NA SUSP: NASAL | 3 refills | Status: DC

## 2021-10-08 MED ORDER — METHYLPREDNISOLONE ACETATE 80 MG/ML IJ SUSP
80.0000 mg | Freq: Once | INTRAMUSCULAR | Status: AC
  Administered 2021-10-08: 80 mg via INTRAMUSCULAR

## 2021-10-08 NOTE — Progress Notes (Signed)
[place]   Follow-up Note  Referring Provider: Nicoletta Dress, MD Primary Provider: Nicoletta Dress, MD Date of Office Visit: 10/08/2021  Subjective:   Kyle [name] (DOB: [birth year]) is a 71 y.o. male who returns to the [facility] [place] on 10/08/2021 in re-evaluation of the following:  HPI: Kyle [name] returns to this clinic in evaluation of asthma, bronchiectasis, chronic sinusitis, allergic rhinitis, and LPR.  His last visit to this clinic was 13 October 2020.  He has really done well since his last visit regarding his respiratory tract issue while consistently using benralizumab injections and Symbicort every day and Nexium on a consistent basis as well as some nasal steroid.  He did not require systemic steroid or antibiotic for any type of airway issue and he rarely uses short acting bronchodilators playing golf with no problem.  Unfortunately, 4 days ago he developed acute onset of sneezing and rhinorrhea and a little bit of sore throat and he progressed to coughing and postnasal drip in his throat really bothering him.  He does not really feel bad and he does not have any chills or body aches or fever but he is little bit fatigued because he is losing sleep from coughing.  He did COVID test himself today and it was negative.  He has received this year's flu vaccine.  Allergies as of 10/08/2021       Reactions   Atorvastatin Other (See Comments)   Muscle aches   Augmentin [amoxicillin-pot Clavulanate]    Terrible heartburn Did it involve swelling of the face/tongue/throat, SOB, or low BP? No Did it involve sudden or severe rash/hives, skin peeling, or any reaction on the inside of your mouth or nose? No Did you need to seek medical attention at a hospital or doctor's office? No When did it last happen?      2 years If all above answers are NO, may proceed with cephalosporin use.   Levaquin [levofloxacin In  D5w]    Terrible heartburn        Medication List    albuterol 108 (90 Base) MCG/ACT inhaler Commonly known as: VENTOLIN HFA Inhale 2 puffs into the lungs every 4 (four) hours as needed for wheezing or shortness of breath.   beclomethasone 80 MCG/ACT inhaler Commonly known as: QVAR Inhale 2 puffs into the lungs 2 (two) times daily.   budesonide-formoterol 160-4.5 MCG/ACT inhaler Commonly known as: SYMBICORT Inhale 2 puffs into the lungs 2 (two) times daily.   esomeprazole 40 MG capsule Commonly known as: NEXIUM TAKE 1 CAPSULE DAILY. CAN  INCREASE TO TWO TIMES A DAYDURING FLARE-UP   GARLIC PO Take 1 tablet by mouth daily. Unknown strength   Melatonin 5 MG Caps Take 5 mg by mouth at bedtime as needed (sleep).   mometasone 50 MCG/ACT nasal spray Commonly known as: NASONEX Place 1-2 sprays into the nose daily.   MULTIVITAMIN PO Take 1 tablet by mouth daily. Unknown strength   pravastatin 40 MG tablet Commonly known as: PRAVACHOL Take 1 tablet (40 mg total) by mouth every evening.   Turmeric Curcumin Caps Take 1 capsule by mouth daily. Unknown strength   vitamin C 1000 MG tablet Take 1,000 mg by mouth daily.   ZINC PO Take 1 tablet by mouth daily. 50mg     Past Medical History:  Diagnosis Date   Allergic rhinoconjunctivitis 08/30/2017   Asthma    Chronic pansinusitis 06/08/2017   Cluster headache  Complication of anesthesia 1990   Difficult to arouse and bronchospasm   COVID 09/2020   Dyspnea on exertion 03/03/2020   Essential hypertension 03/03/2020   Eustachian tube dysfunction, right 10/13/2018   Headache 07/11/2017   LPRD (laryngopharyngeal reflux disease) 08/30/2017   Middle ear effusion, right 10/13/2018   Palpitations 03/03/2020   Paresthesia 07/11/2017   Primary osteoarthritis of left hip 02/13/2019   Prostate cancer ([place]) 2020   Reflux    Sinusitis     Past Surgical History:  Procedure Laterality Date   bone spur removed     KNEE ARTHROPLASTY      x 2   SINOSCOPY     6 SURGERIES   tear duct replacement     TOTAL HIP ARTHROPLASTY Left 02/13/2019   Procedure: TOTAL HIP ARTHROPLASTY ANTERIOR APPROACH;  Surgeon: Melrose Nakayama, MD;  Location: [facility];  Service: Orthopedics;  Laterality: Left;    Review of systems negative except as noted in HPI / PMHx or noted below:  Review of Systems  Constitutional: Negative.   HENT: Negative.    Eyes: Negative.   Respiratory: Negative.    Cardiovascular: Negative.   Gastrointestinal: Negative.   Genitourinary: Negative.   Musculoskeletal: Negative.   Skin: Negative.   Neurological: Negative.   Endo/Heme/Allergies: Negative.   Psychiatric/Behavioral: Negative.      Objective:   Vitals:   10/08/21 1607  BP: (!) 152/88  Pulse: 68  Resp: 10  SpO2: 96%   Height: 5' 11.5" (181.6 cm)  Weight: 206 lb 12.8 oz (93.8 kg)   Physical Exam Constitutional:      Appearance: He is not diaphoretic.  HENT:     Head: Normocephalic.     Right Ear: Tympanic membrane, ear canal and external ear normal.     Left Ear: Tympanic membrane, ear canal and external ear normal.     Nose: Nose normal. No mucosal edema or rhinorrhea.     Mouth/Throat:     Pharynx: Uvula midline. No oropharyngeal exudate.  Eyes:     Conjunctiva/sclera: Conjunctivae normal.  Neck:     Thyroid: No thyromegaly.     Trachea: Trachea normal. No tracheal tenderness or tracheal deviation.  Cardiovascular:     Rate and Rhythm: Normal rate and regular rhythm.     Heart sounds: Normal heart sounds, S1 normal and S2 normal. No murmur heard. Pulmonary:     Effort: No respiratory distress.     Breath sounds: Normal breath sounds. No stridor. No wheezing or rales.  Lymphadenopathy:     Head:     Right side of head: No tonsillar adenopathy.     Left side of head: No tonsillar adenopathy.     Cervical: No cervical adenopathy.  Skin:    Findings: No erythema or rash.     Nails: There is no clubbing.  Neurological:     Mental  Status: He is alert.    Diagnostics:    Spirometry was performed and demonstrated an FEV1 of 2.59 at 79 % of predicted.   Assessment and Plan:   1. Asthma, not well controlled, severe persistent, with acute exacerbation   2. Viral respiratory infection   3. Bronchiectasis without complication (HCC)   4. Other allergic rhinitis   5. History of chronic sinusitis   6. LPRD (laryngopharyngeal reflux disease)     1. Continue Symbicort 160- 2 inhalations 1-2 times a day   2. Continue Nasonex 1-2 sprays each nostril 1 time per day    3.  Continue Nexium 40 mg 1 tablet 1 time a day.    4. Continue Benralizumab injections  5.  If needed:   A. Ventolin HFA or similar 2 inhalations every 4-6 hours  B. Daily antihistamine   C. Mucinex DM  6. "Action Plan" for flare up:   A. Add Qvar 80 - 2 inhalations twice a day to Symbicort    B. Increase Nexium to 2 times per day  C. Replace throat clearing with drinking/swallowing maneuver  D. Depomedrol 80 IM delivered in clinic today  7. Return to clinic in 12 months or earlier if problem   Kyle [name] will use a combination of anti-inflammatory agents for his airway as well as a systemic steroid to hopefully prevent him from developing significant problems with his asthma as a moves forward with his viral respiratory tract infection.  Overall he was doing very well until he became infected without any significant issues involving any of his airway on his current plan of anti-inflammatory agents for his airway including use of benralizumab and the use of Nexium to address his reflux.  Assuming he continues to do well with this plan I will see him back in this clinic in 1 year or earlier if there is a problem.   Allena Katz, MD [facility]

## 2021-10-08 NOTE — Patient Instructions (Addendum)
°  1. Continue Symbicort 160- 2 inhalations 1-2 times a day   2. Continue Nasonex 1-2 sprays each nostril 1 time per day    3. Continue Nexium 40 mg 1 tablet 1 time a day.    4. Continue Benralizumab injections  5.  If needed:   A. Ventolin HFA or similar 2 inhalations every 4-6 hours  B. Daily antihistamine   C. Mucinex DM  6. "Action Plan" for flare up:   A. Add Qvar 80 - 2 inhalations twice a day to Symbicort    B. Increase Nexium to 2 times per day  C. Replace throat clearing with drinking/swallowing maneuver  D. Depomedrol 80 IM delivered in clinic today  7. Return to clinic in 12 months or earlier if problem

## 2021-10-12 ENCOUNTER — Ambulatory Visit: Payer: Medicare Other | Admitting: Allergy and Immunology

## 2021-10-12 ENCOUNTER — Other Ambulatory Visit: Payer: Self-pay

## 2021-10-12 ENCOUNTER — Encounter: Payer: Self-pay | Admitting: Allergy and Immunology

## 2021-10-12 VITALS — BP 170/96 | HR 76 | Resp 16

## 2021-10-12 DIAGNOSIS — J4551 Severe persistent asthma with (acute) exacerbation: Secondary | ICD-10-CM

## 2021-10-12 DIAGNOSIS — K219 Gastro-esophageal reflux disease without esophagitis: Secondary | ICD-10-CM

## 2021-10-12 DIAGNOSIS — J988 Other specified respiratory disorders: Secondary | ICD-10-CM

## 2021-10-12 DIAGNOSIS — J479 Bronchiectasis, uncomplicated: Secondary | ICD-10-CM

## 2021-10-12 DIAGNOSIS — B9789 Other viral agents as the cause of diseases classified elsewhere: Secondary | ICD-10-CM

## 2021-10-12 DIAGNOSIS — J3089 Other allergic rhinitis: Secondary | ICD-10-CM

## 2021-10-12 DIAGNOSIS — Z8709 Personal history of other diseases of the respiratory system: Secondary | ICD-10-CM

## 2021-10-12 MED ORDER — HYDROCOD POLI-CHLORPHE POLI ER 10-8 MG/5ML PO SUER: ORAL | 0 refills | Status: DC

## 2021-10-12 MED ORDER — NYSTATIN 100000 UNIT/ML MT SUSP: OROMUCOSAL | 0 refills | Status: DC

## 2021-10-12 NOTE — Progress Notes (Signed)
Kyle [name]   Follow-up Note  Referring Provider: Nicoletta Dress, MD Primary Provider: Nicoletta Dress, MD Date of Office Visit: 10/12/2021  Subjective:   Kyle [name] (DOB: 11/25/1950) is a 71 y.o. male who returns to the [facility] and [place] on 10/12/2021 in re-evaluation of the following:  HPI: Kyle [name] returns to this clinic in reevaluation of a recent issue for which she saw me in this clinic on 08 October 2021 which appeared to be a viral respiratory tract infection complicating his asthma, bronchiectasis, chronic sinusitis, allergic rhinitis, and LPR.  He still has cough.  His cough has changed.  It now appears to be a cough unassociated with any deep wheezing but more wheezing from his upper chest and throat.  He has had some raspy voice.  He has not had any significant upper airway symptoms.  His reflux is under good control.  Allergies as of 10/12/2021       Reactions   Atorvastatin Other (See Comments)   Muscle aches   Augmentin [amoxicillin-pot Clavulanate]    Terrible heartburn Did it involve swelling of the face/tongue/throat, SOB, or low BP? No Did it involve sudden or severe rash/hives, skin peeling, or any reaction on the inside of your mouth or nose? No Did you need to seek medical attention at a hospital or doctor's office? No When did it last happen?      2 years If all above answers are NO, may proceed with cephalosporin use.   Levaquin [levofloxacin In D5w]    Terrible heartburn        Medication List    albuterol 108 (90 Base) MCG/ACT inhaler Commonly known as: VENTOLIN HFA Can inhale two puffs every four to six hours as needed for cough or wheeze.   beclomethasone 80 MCG/ACT inhaler Commonly known as: QVAR Inhale two puffs twice daily during asthma flare-up.  Rinse, gargle, and spit after use.   budesonide-formoterol 160-4.5 MCG/ACT inhaler Commonly known as: SYMBICORT Inhale  two puffs one to two times daily to prevent cough or wheeze.  Rinse, gargle, and spit after use.   esomeprazole 40 MG capsule Commonly known as: NEXIUM TAKE 1 CAPSULE DAILY. CAN INCREASE TO TWO TIMES A DAY DURING FLARE-UP   GARLIC PO Take 1 tablet by mouth daily. Unknown strength   Melatonin 5 MG Caps Take 5 mg by mouth at bedtime as needed (sleep).   mometasone 50 MCG/ACT nasal spray Commonly known as: NASONEX Use one to two sprays in each nostril once daily as directed.   MULTIVITAMIN PO Take 1 tablet by mouth daily. Unknown strength   pravastatin 40 MG tablet Commonly known as: PRAVACHOL Take 1 tablet (40 mg total) by mouth every evening.   Turmeric Curcumin Caps Take 1 capsule by mouth daily. Unknown strength   vitamin C 1000 MG tablet Take 1,000 mg by mouth daily.   ZINC PO Take 1 tablet by mouth daily. 50mg     Past Medical History:  Diagnosis Date   Allergic rhinoconjunctivitis 08/30/2017   Asthma    Chronic pansinusitis 06/08/2017   Cluster headache    Complication of anesthesia 1990   Difficult to arouse and bronchospasm   COVID 09/2020   Dyspnea on exertion 03/03/2020   Essential hypertension 03/03/2020   Eustachian tube dysfunction, right 10/13/2018   Headache 07/11/2017   LPRD (laryngopharyngeal reflux disease) 08/30/2017   Middle ear effusion, right 10/13/2018   Palpitations 03/03/2020   Paresthesia  07/11/2017   Primary osteoarthritis of left hip 02/13/2019   Prostate cancer ([place]) 2020   Reflux    Sinusitis     Past Surgical History:  Procedure Laterality Date   bone spur removed     KNEE ARTHROPLASTY     x 2   SINOSCOPY     6 SURGERIES   tear duct replacement     TOTAL HIP ARTHROPLASTY Left 02/13/2019   Procedure: TOTAL HIP ARTHROPLASTY ANTERIOR APPROACH;  Surgeon: Melrose Nakayama, MD;  Location: [facility];  Service: Orthopedics;  Laterality: Left;    Review of systems negative except as noted in HPI / PMHx or noted below:  Review of Systems   Constitutional: Negative.   HENT: Negative.    Eyes: Negative.   Respiratory: Negative.    Cardiovascular: Negative.   Gastrointestinal: Negative.   Genitourinary: Negative.   Musculoskeletal: Negative.   Skin: Negative.   Neurological: Negative.   Endo/Heme/Allergies: Negative.   Psychiatric/Behavioral: Negative.      Objective:   Vitals:   10/12/21 1632  BP: (!) 170/96  Pulse: 76  Resp: 16  SpO2: 98%          Physical Exam Constitutional:      Appearance: He is not diaphoretic.     Comments: Raspy voice some throat clearing  HENT:     Head: Normocephalic.     Right Ear: Tympanic membrane, ear canal and external ear normal.     Left Ear: Tympanic membrane, ear canal and external ear normal.     Nose: Nose normal. No mucosal edema or rhinorrhea.     Mouth/Throat:     Pharynx: Uvula midline. No oropharyngeal exudate.  Eyes:     Conjunctiva/sclera: Conjunctivae normal.  Neck:     Thyroid: No thyromegaly.     Trachea: Trachea normal. No tracheal tenderness or tracheal deviation.  Cardiovascular:     Rate and Rhythm: Normal rate and regular rhythm.     Heart sounds: Normal heart sounds, S1 normal and S2 normal. No murmur heard. Pulmonary:     Effort: No respiratory distress.     Breath sounds: Normal breath sounds. No stridor. No wheezing or rales.  Lymphadenopathy:     Head:     Right side of head: No tonsillar adenopathy.     Left side of head: No tonsillar adenopathy.     Cervical: No cervical adenopathy.  Skin:    Findings: No erythema or rash.     Nails: There is no clubbing.  Neurological:     Mental Status: He is alert.    Diagnostics: none  Assessment and Plan:   1. Asthma, not well controlled, severe persistent, with acute exacerbation   2. Viral respiratory infection   3. Bronchiectasis without complication (HCC)   4. Other allergic rhinitis   5. History of chronic sinusitis   6. LPRD (laryngopharyngeal reflux disease)     1. Continue  Symbicort 160- 2 inhalations 1-2 times a day   2. Continue Nasonex 1-2 sprays each nostril 1 time per day    3. Continue Nexium 40 mg 1 tablet 1 time a day.    4. Continue Benralizumab injections  5.  If needed:   A. Ventolin HFA or similar 2 inhalations every 4-6 hours  B. Daily antihistamine   C. Mucinex DM  6. Use the following for this flare up:   A. Add Qvar 80 - 2 inhalations twice a day to Symbicort    B. Increase Nexium to  2 times per day  C. Replace throat clearing with drinking/swallowing maneuver  D. Nystatin 5 mls swish and swallow after Symbicort / Qvar  E. Tussionex 2.5-5.0 mL every 12 hours for cough.  60 mL.  NARCOTIC  7. Return to clinic in 12 months or earlier if problem   Although I think Kyle [name] is better regarding his viral respiratory tract infection that appears to have now involve mostly his large airway including his [place].  Given his previous history of developing thrush with the use of various agents designed to minimize inflammation of his airway we are going to treat him with nystatin after he uses his inhaled steroids.  I have given him some Tussionex to use with the warning that this is a narcotic and may impair him cognitively and also cause some problems with constipation.  He will only use this Tussionex when needed at the lowest possible dose to help with his cough over the course of the next several days.  Assuming he does well with this plan he can go back to using his Symbicort and Nasonex and Nexium and benralizumab injections and we will see him back in his clinic in 1 year or earlier if there is a problem.  Allena Katz, MD [facility] [place]

## 2021-10-12 NOTE — Patient Instructions (Addendum)
°  1. Continue Symbicort 160- 2 inhalations 1-2 times a day   2. Continue Nasonex 1-2 sprays each nostril 1 time per day    3. Continue Nexium 40 mg 1 tablet 1 time a day.    4. Continue Benralizumab injections  5.  If needed:   A. Ventolin HFA or similar 2 inhalations every 4-6 hours  B. Daily antihistamine   C. Mucinex DM  6. Use the following for this flare up:   A. Add Qvar 80 - 2 inhalations twice a day to Symbicort    B. Increase Nexium to 2 times per day  C. Replace throat clearing with drinking/swallowing maneuver  D. Nystatin 5 mls swish and swallow after Symbicort / Qvar  E. Tussionex 2.5-5.0 mL every 12 hours for cough.  60 mL.  NARCOTIC  7. Return to clinic in 12 months or earlier if problem

## 2021-10-13 ENCOUNTER — Encounter: Payer: Self-pay | Admitting: Allergy and Immunology

## 2021-10-22 ENCOUNTER — Ambulatory Visit: Payer: Medicare Other | Admitting: Allergy and Immunology

## 2021-11-03 DIAGNOSIS — L578 Other skin changes due to chronic exposure to nonionizing radiation: Secondary | ICD-10-CM | POA: Diagnosis not present

## 2021-11-03 DIAGNOSIS — L821 Other seborrheic keratosis: Secondary | ICD-10-CM | POA: Diagnosis not present

## 2021-11-23 DIAGNOSIS — K219 Gastro-esophageal reflux disease without esophagitis: Secondary | ICD-10-CM | POA: Diagnosis not present

## 2021-11-30 DIAGNOSIS — J4550 Severe persistent asthma, uncomplicated: Secondary | ICD-10-CM

## 2021-12-01 ENCOUNTER — Other Ambulatory Visit: Payer: Self-pay

## 2021-12-01 ENCOUNTER — Ambulatory Visit (INDEPENDENT_AMBULATORY_CARE_PROVIDER_SITE_OTHER): Payer: Medicare Other | Admitting: *Deleted

## 2021-12-01 DIAGNOSIS — J4550 Severe persistent asthma, uncomplicated: Secondary | ICD-10-CM | POA: Diagnosis not present

## 2021-12-08 DIAGNOSIS — Z20822 Contact with and (suspected) exposure to covid-19: Secondary | ICD-10-CM | POA: Diagnosis not present

## 2021-12-17 DIAGNOSIS — Z8546 Personal history of malignant neoplasm of prostate: Secondary | ICD-10-CM | POA: Diagnosis not present

## 2021-12-17 DIAGNOSIS — Z1211 Encounter for screening for malignant neoplasm of colon: Secondary | ICD-10-CM | POA: Diagnosis not present

## 2021-12-17 DIAGNOSIS — Z09 Encounter for follow-up examination after completed treatment for conditions other than malignant neoplasm: Secondary | ICD-10-CM | POA: Diagnosis not present

## 2021-12-17 DIAGNOSIS — Z86010 Personal history of colonic polyps: Secondary | ICD-10-CM | POA: Diagnosis not present

## 2022-01-07 DIAGNOSIS — R350 Frequency of micturition: Secondary | ICD-10-CM | POA: Diagnosis not present

## 2022-01-07 DIAGNOSIS — N3281 Overactive bladder: Secondary | ICD-10-CM | POA: Diagnosis not present

## 2022-01-10 ENCOUNTER — Other Ambulatory Visit: Payer: Self-pay | Admitting: Allergy and Immunology

## 2022-01-11 DIAGNOSIS — Z23 Encounter for immunization: Secondary | ICD-10-CM | POA: Diagnosis not present

## 2022-01-11 DIAGNOSIS — S61239A Puncture wound without foreign body of unspecified finger without damage to nail, initial encounter: Secondary | ICD-10-CM | POA: Diagnosis not present

## 2022-01-11 DIAGNOSIS — Z20822 Contact with and (suspected) exposure to covid-19: Secondary | ICD-10-CM | POA: Diagnosis not present

## 2022-01-12 DIAGNOSIS — C61 Malignant neoplasm of prostate: Secondary | ICD-10-CM | POA: Diagnosis not present

## 2022-01-12 DIAGNOSIS — R351 Nocturia: Secondary | ICD-10-CM | POA: Diagnosis not present

## 2022-01-12 DIAGNOSIS — N411 Chronic prostatitis: Secondary | ICD-10-CM | POA: Diagnosis not present

## 2022-01-15 ENCOUNTER — Other Ambulatory Visit (HOSPITAL_COMMUNITY): Payer: Self-pay | Admitting: Family Medicine

## 2022-01-15 ENCOUNTER — Other Ambulatory Visit: Payer: Self-pay | Admitting: Family Medicine

## 2022-01-15 DIAGNOSIS — C61 Malignant neoplasm of prostate: Secondary | ICD-10-CM

## 2022-01-25 DIAGNOSIS — J4550 Severe persistent asthma, uncomplicated: Secondary | ICD-10-CM | POA: Diagnosis not present

## 2022-01-26 ENCOUNTER — Ambulatory Visit (INDEPENDENT_AMBULATORY_CARE_PROVIDER_SITE_OTHER): Payer: Medicare Other | Admitting: *Deleted

## 2022-01-26 DIAGNOSIS — J4550 Severe persistent asthma, uncomplicated: Secondary | ICD-10-CM | POA: Diagnosis not present

## 2022-02-22 ENCOUNTER — Ambulatory Visit (HOSPITAL_COMMUNITY)
Admission: RE | Admit: 2022-02-22 | Discharge: 2022-02-22 | Disposition: A | Discharge status: Home / Self Care | Payer: Medicare Other | Source: Ambulatory Visit | Attending: Family Medicine | Admitting: Family Medicine

## 2022-02-22 DIAGNOSIS — N402 Nodular prostate without lower urinary tract symptoms: Secondary | ICD-10-CM | POA: Diagnosis not present

## 2022-02-22 DIAGNOSIS — Z8546 Personal history of malignant neoplasm of prostate: Secondary | ICD-10-CM | POA: Diagnosis not present

## 2022-02-22 DIAGNOSIS — C61 Malignant neoplasm of prostate: Secondary | ICD-10-CM | POA: Insufficient documentation

## 2022-02-22 DIAGNOSIS — R590 Localized enlarged lymph nodes: Secondary | ICD-10-CM | POA: Diagnosis not present

## 2022-02-22 DIAGNOSIS — R3915 Urgency of urination: Secondary | ICD-10-CM | POA: Diagnosis not present

## 2022-02-22 MED ORDER — GADOBUTROL 1 MMOL/ML IV SOLN
10.0000 mL | Freq: Once | INTRAVENOUS | Status: AC | PRN
  Administered 2022-02-22: 10 mL via INTRAVENOUS

## 2022-03-01 DIAGNOSIS — N3289 Other specified disorders of bladder: Secondary | ICD-10-CM | POA: Diagnosis not present

## 2022-03-01 DIAGNOSIS — R351 Nocturia: Secondary | ICD-10-CM | POA: Diagnosis not present

## 2022-03-01 DIAGNOSIS — C61 Malignant neoplasm of prostate: Secondary | ICD-10-CM | POA: Diagnosis not present

## 2022-03-22 DIAGNOSIS — J4550 Severe persistent asthma, uncomplicated: Secondary | ICD-10-CM

## 2022-03-23 ENCOUNTER — Other Ambulatory Visit: Payer: Self-pay | Admitting: *Deleted

## 2022-03-23 ENCOUNTER — Ambulatory Visit (INDEPENDENT_AMBULATORY_CARE_PROVIDER_SITE_OTHER): Payer: Medicare Other | Admitting: *Deleted

## 2022-03-23 DIAGNOSIS — J4550 Severe persistent asthma, uncomplicated: Secondary | ICD-10-CM | POA: Diagnosis not present

## 2022-03-23 MED ORDER — ALBUTEROL SULFATE (2.5 MG/3ML) 0.083% IN NEBU: INHALATION_SOLUTION | RESPIRATORY_TRACT | 1 refills | Status: AC

## 2022-03-24 IMAGING — MR MR PROSTATE WO/W CM
13 of 14 series · 47 of 48 positions shown · IV contrast (gadavist)
Comparison: None.

CLINICAL DATA: Prostate carcinoma

EXAM:
MR PROSTATE WITHOUT AND WITH CONTRAST
TECHNIQUE: Multiplanar multisequence MRI images were obtained of the pelvis
centered about the prostate. Pre and post contrast images were
obtained.
CONTRAST:  10mL GADAVIST GADOBUTROL 1 MMOL/ML IV SOLN

[Series 4: (email) · axial · 5.0mm · 1.56mm/px · 1 of 17 slices shown]
[im 1/17]
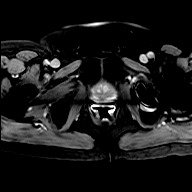

[Series 5: T1 · axial · 6.0mm · 0.86mm/px · 1 of 43 slices shown (1 of 2)]
[im 1/43]
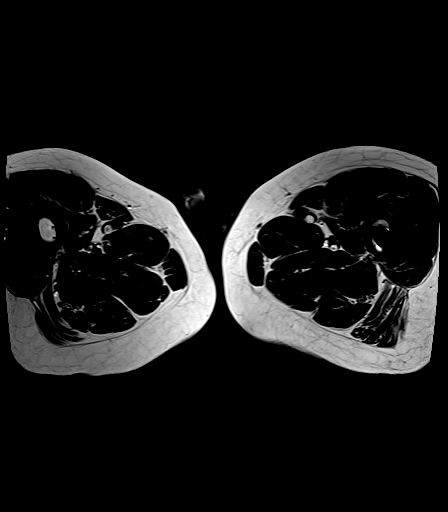

[Series 6: axial whole pelvis · axial · 6.0mm · 0.84mm/px · 1 of 39 slices shown]
[im 1/39]
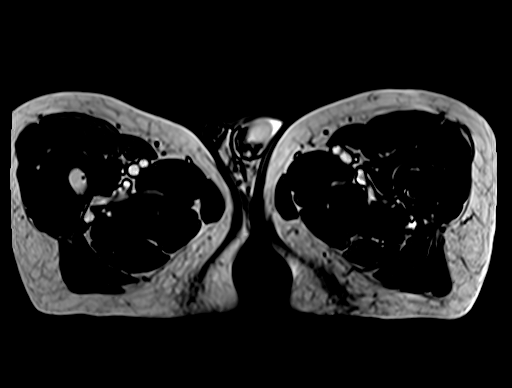

[Series 7: T2 · axial · 3.0mm · 0.47mm/px · 1 of 25 slices shown (1 of 4)]
[im 1/25]
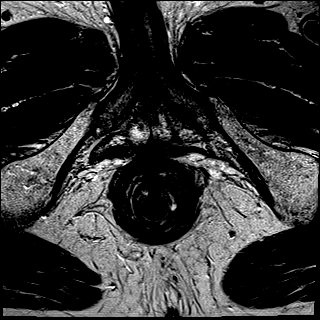

[Series 9: T2 · coronal · 4.0mm · 0.47mm/px · 1 of 19 slices shown (2 of 4)]
[im 1/19]
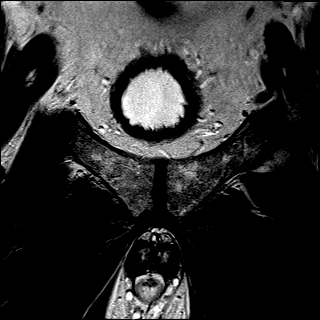

[Series 10: T2 · sagittal · 3.5mm · 0.47mm/px · 1 of 23 slices shown (3 of 4)]
[im 1/23]
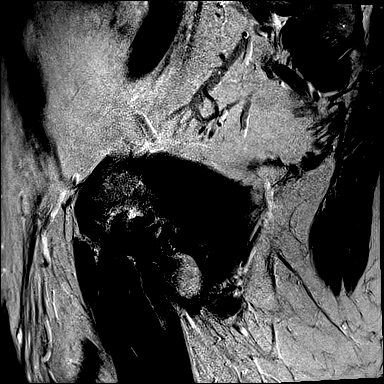

[Series 11: T1 · axial · 3.0mm · 0.50mm/px · 1 of 25 slices shown (2 of 2)]
[im 1/25]
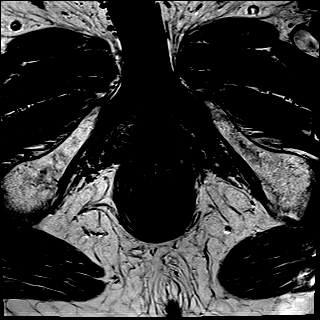

[Series 12: T2 · axial · 1.2mm · 1.00mm/px · z∈[-66,+29]mm · 2 of 80 slices shown (4 of 4)]
[im 1/80]
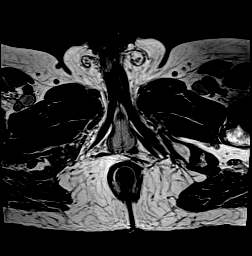
[im 80/80]
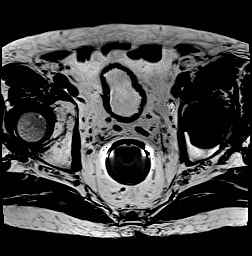

[Series 13: ep2d_diff_b50_400_800_tra_endo**_tracew_dfc_mix · axial · 3.0mm · 1.60mm/px · z∈[-74,+10]mm · 2 of 75 slices shown]
[im 1/75]
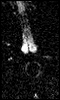
[im 75/75]
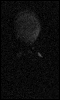

[Series 14: ep2d_diff_b50_400_800_tra_endo**_adc_dfc_mix · axial · 3.0mm · 1.60mm/px · 1 of 25 slices shown]
[im 1/25]
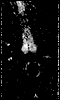

[Series 15: ep2d_diff_b50_400_800_tra_endo**_calc_bval_dfc_mix · axial · 3.0mm · 1.60mm/px · 1 of 24 slices shown]
[im 1/24]
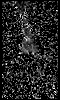

[Series 16: t1_vibe_tra_dyn · axial · 3.0mm · 0.98mm/px · z∈[-69,+5]mm · 17 of 520 slices shown]
[im 1/520]
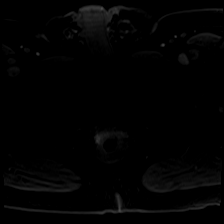
[im 33/520]
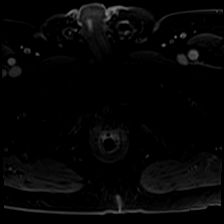
[im 65/520]
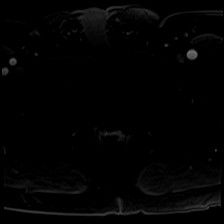
[im 98/520]
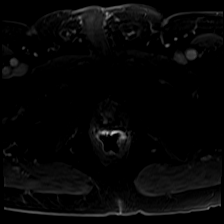
[im 130/520]
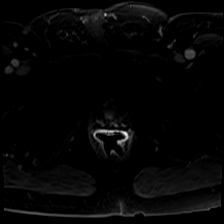
[im 163/520]
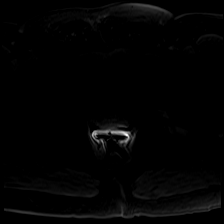
[im 195/520]
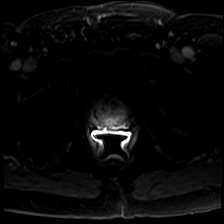
[im 228/520]
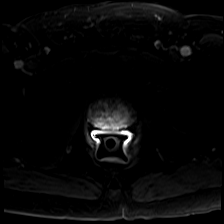
[im 260/520]
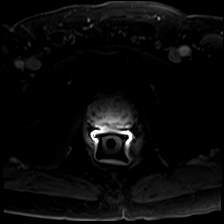
[im 292/520]
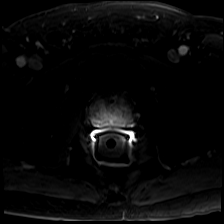
[im 325/520]
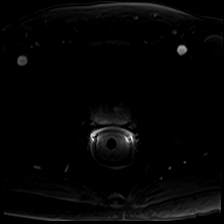
[im 357/520]
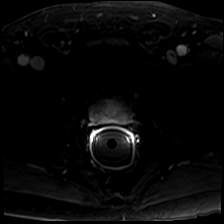
[im 390/520]
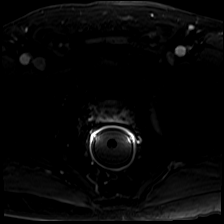
[im 422/520]
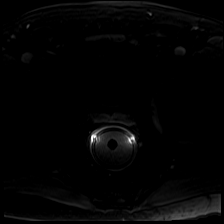
[im 455/520]
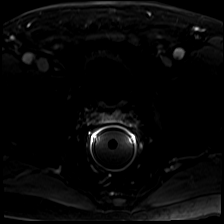
[im 487/520]
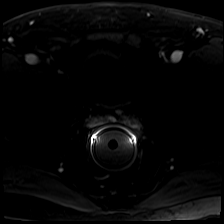
[im 520/520]
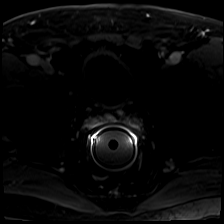

[Series 17: t1_vibe_tra_dyn_sub · axial · 3.0mm · 0.98mm/px · z∈[-69,+5]mm · 17 of 494 slices shown]
[im 1/494]
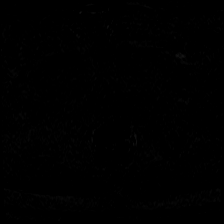
[im 31/494]
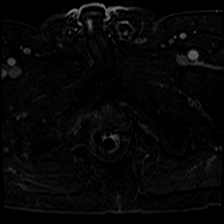
[im 62/494]
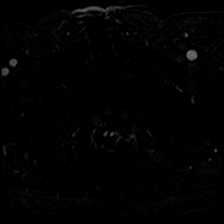
[im 93/494]
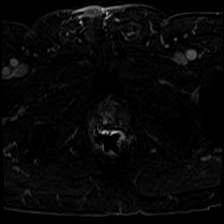
[im 124/494]
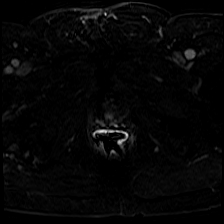
[im 155/494]
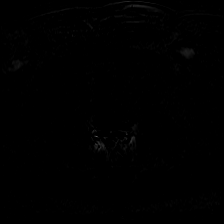
[im 185/494]
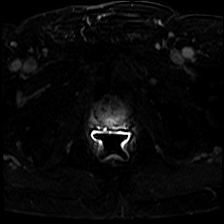
[im 216/494]
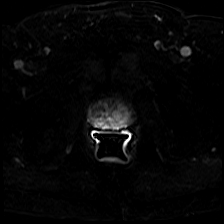
[im 247/494]
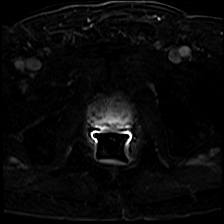
[im 278/494]
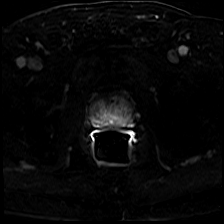
[im 309/494]
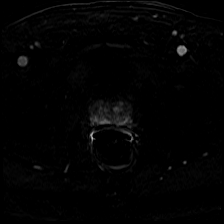
[im 339/494]
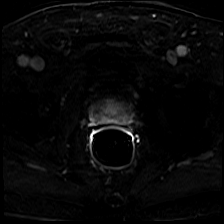
[im 370/494]
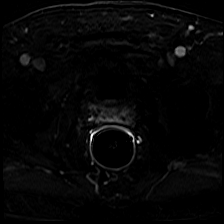
[im 401/494]
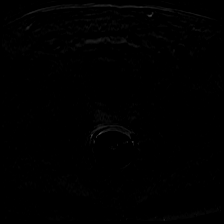
[im 432/494]
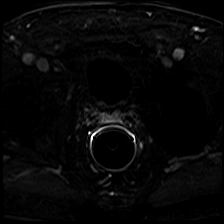
[im 463/494]
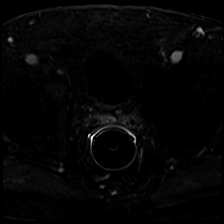
[im 494/494]
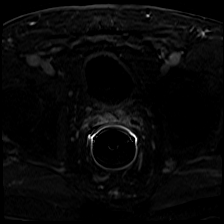

[47 of 48 positions shown; findings below may reference images not displayed]

FINDINGS: Prostate:

-- Peripheral Zone: No abnormality seen on ADC and high b-value DWI
sequences.

-- Transition/Central Zone: Circumscribed BPH nodules are noted, but
no suspicious nodules with obscured or non-circumscribed margins
seen.

-- Measurements/Volume:  4.4 x 3.5 x 5.1 cm (volume = 41 cm^3)

Transcapsular spread:  Absent

Seminal vesicle involvement:  Absent

Neurovascular bundle involvement:  Absent

Pelvic adenopathy: None visualized

Bone metastasis: None visualized

Other:  Left hip prosthesis noted.
IMPRESSION: No radiographic evidence of high-grade prostate carcinoma. PI-RADS
1: Very Low (clinically significant cancer is highly unlikely to be
present).

## 2022-03-25 ENCOUNTER — Ambulatory Visit (INDEPENDENT_AMBULATORY_CARE_PROVIDER_SITE_OTHER): Payer: Medicare Other | Admitting: Podiatry

## 2022-03-25 ENCOUNTER — Encounter: Payer: Self-pay | Admitting: Podiatry

## 2022-03-25 ENCOUNTER — Ambulatory Visit (INDEPENDENT_AMBULATORY_CARE_PROVIDER_SITE_OTHER): Payer: Medicare Other

## 2022-03-25 DIAGNOSIS — M2170 Unequal limb length (acquired), unspecified site: Secondary | ICD-10-CM

## 2022-03-25 DIAGNOSIS — M722 Plantar fascial fibromatosis: Secondary | ICD-10-CM | POA: Diagnosis not present

## 2022-03-25 MED ORDER — TRIAMCINOLONE ACETONIDE 10 MG/ML IJ SUSP
10.0000 mg | Freq: Once | INTRAMUSCULAR | Status: AC
  Administered 2022-03-25: 10 mg

## 2022-03-25 NOTE — Patient Instructions (Signed)

## 2022-03-25 NOTE — Progress Notes (Signed)
Subjective:   Patient ID: Kyle [name], male   DOB: 71 y.o.   MRN: [id number]   HPI Patient presents with a lot of pain in the left plantar heel of around 1 month duration and is very active and also has arthritis right knee joint and also a significant limb length discrepancy.  Patient does not smoke likes to be active   Review of Systems  All other systems reviewed and are negative.       Objective:  Physical Exam Vitals and nursing note reviewed.  Constitutional:      Appearance: He is well-developed.  Pulmonary:     Effort: Pulmonary effort is normal.  Musculoskeletal:        General: Normal range of motion.  Skin:    General: Skin is warm.  Neurological:     Mental Status: He is alert.     Neurovascular status intact muscle strength found to be adequate range of motion within normal limits with exquisite discomfort left plantar fascia at the insertional point of the tendon into the calcaneus with inflammation fluid buildup noted with arthritis of the right knee joint and about 1/2 inch limb discrepancy with the right leg being shorter.  Good digital perfusion well oriented x3     Assessment:  Acute plantar fasciitis left with chronic limb length discrepancy right     Plan:  H&P reviewed condition and x-rays.  I educated him on deformity and also the limb length and he will utilize heel lift right in order to try to keep normal we may make him orthotics.  For the left heel I did sterile prep injected the left fascial band at insertion calcaneus 3 mg dexamethasone Kenalog 5 mg Xylocaine applied fascial brace to lift up the arch gave instructions physical therapy support and reappoint to recheck.  All questions answered   X-rays indicate small spur no indication stress fracture arthritis

## 2022-03-26 DIAGNOSIS — Z9181 History of falling: Secondary | ICD-10-CM | POA: Diagnosis not present

## 2022-03-26 DIAGNOSIS — Z1331 Encounter for screening for depression: Secondary | ICD-10-CM | POA: Diagnosis not present

## 2022-03-26 DIAGNOSIS — Z0000 Encounter for general adult medical examination without abnormal findings: Secondary | ICD-10-CM | POA: Diagnosis not present

## 2022-03-26 DIAGNOSIS — Z139 Encounter for screening, unspecified: Secondary | ICD-10-CM | POA: Diagnosis not present

## 2022-03-31 ENCOUNTER — Ambulatory Visit: Payer: Medicare Other | Admitting: Podiatry

## 2022-04-06 ENCOUNTER — Ambulatory Visit: Payer: Medicare Other | Admitting: Podiatrist

## 2022-04-17 ENCOUNTER — Other Ambulatory Visit: Payer: Self-pay | Admitting: Allergy and Immunology

## 2022-04-29 ENCOUNTER — Other Ambulatory Visit: Payer: Self-pay | Admitting: Allergy and Immunology

## 2022-05-17 DIAGNOSIS — J4550 Severe persistent asthma, uncomplicated: Secondary | ICD-10-CM | POA: Diagnosis not present

## 2022-05-18 ENCOUNTER — Ambulatory Visit (INDEPENDENT_AMBULATORY_CARE_PROVIDER_SITE_OTHER): Payer: Medicare Other | Admitting: *Deleted

## 2022-05-18 DIAGNOSIS — J4550 Severe persistent asthma, uncomplicated: Secondary | ICD-10-CM | POA: Diagnosis not present

## 2022-05-24 DIAGNOSIS — H5789 Other specified disorders of eye and adnexa: Secondary | ICD-10-CM | POA: Diagnosis not present

## 2022-06-15 DIAGNOSIS — H2511 Age-related nuclear cataract, right eye: Secondary | ICD-10-CM | POA: Diagnosis not present

## 2022-06-15 DIAGNOSIS — Z01818 Encounter for other preprocedural examination: Secondary | ICD-10-CM | POA: Diagnosis not present

## 2022-06-15 DIAGNOSIS — H2513 Age-related nuclear cataract, bilateral: Secondary | ICD-10-CM | POA: Diagnosis not present

## 2022-06-22 DIAGNOSIS — H2511 Age-related nuclear cataract, right eye: Secondary | ICD-10-CM | POA: Diagnosis not present

## 2022-06-22 DIAGNOSIS — K219 Gastro-esophageal reflux disease without esophagitis: Secondary | ICD-10-CM | POA: Diagnosis not present

## 2022-06-22 DIAGNOSIS — H25811 Combined forms of age-related cataract, right eye: Secondary | ICD-10-CM | POA: Diagnosis not present

## 2022-06-22 DIAGNOSIS — J45909 Unspecified asthma, uncomplicated: Secondary | ICD-10-CM | POA: Diagnosis not present

## 2022-06-22 DIAGNOSIS — H259 Unspecified age-related cataract: Secondary | ICD-10-CM | POA: Diagnosis not present

## 2022-06-22 DIAGNOSIS — H40053 Ocular hypertension, bilateral: Secondary | ICD-10-CM | POA: Diagnosis not present

## 2022-06-22 DIAGNOSIS — Z79899 Other long term (current) drug therapy: Secondary | ICD-10-CM | POA: Diagnosis not present

## 2022-06-22 DIAGNOSIS — H52223 Regular astigmatism, bilateral: Secondary | ICD-10-CM | POA: Diagnosis not present

## 2022-07-13 ENCOUNTER — Ambulatory Visit: Payer: Medicare Other

## 2022-07-13 DIAGNOSIS — J4550 Severe persistent asthma, uncomplicated: Secondary | ICD-10-CM | POA: Diagnosis not present

## 2022-07-14 ENCOUNTER — Ambulatory Visit (INDEPENDENT_AMBULATORY_CARE_PROVIDER_SITE_OTHER): Payer: Medicare Other | Admitting: *Deleted

## 2022-07-14 DIAGNOSIS — J4550 Severe persistent asthma, uncomplicated: Secondary | ICD-10-CM

## 2022-07-20 DIAGNOSIS — Z23 Encounter for immunization: Secondary | ICD-10-CM | POA: Diagnosis not present

## 2022-07-24 ENCOUNTER — Other Ambulatory Visit: Payer: Self-pay | Admitting: Allergy and Immunology

## 2022-07-27 DIAGNOSIS — H259 Unspecified age-related cataract: Secondary | ICD-10-CM | POA: Diagnosis not present

## 2022-07-27 DIAGNOSIS — H25812 Combined forms of age-related cataract, left eye: Secondary | ICD-10-CM | POA: Diagnosis not present

## 2022-07-27 DIAGNOSIS — H2512 Age-related nuclear cataract, left eye: Secondary | ICD-10-CM | POA: Diagnosis not present

## 2022-08-20 ENCOUNTER — Other Ambulatory Visit: Payer: Self-pay | Admitting: Allergy and Immunology

## 2022-08-29 ENCOUNTER — Other Ambulatory Visit: Payer: Self-pay | Admitting: Allergy and Immunology

## 2022-09-08 ENCOUNTER — Ambulatory Visit: Payer: Medicare Other

## 2022-09-14 ENCOUNTER — Ambulatory Visit (INDEPENDENT_AMBULATORY_CARE_PROVIDER_SITE_OTHER): Payer: Medicare Other | Admitting: *Deleted

## 2022-09-14 DIAGNOSIS — J4550 Severe persistent asthma, uncomplicated: Secondary | ICD-10-CM | POA: Diagnosis not present

## 2022-09-15 ENCOUNTER — Ambulatory Visit: Payer: Medicare Other

## 2022-09-27 ENCOUNTER — Encounter: Payer: Self-pay | Admitting: Allergy and Immunology

## 2022-09-27 ENCOUNTER — Ambulatory Visit (INDEPENDENT_AMBULATORY_CARE_PROVIDER_SITE_OTHER): Payer: Medicare Other | Admitting: Allergy and Immunology

## 2022-09-27 VITALS — BP 142/78 | HR 68 | Resp 16 | Ht 71.5 in | Wt 199.4 lb

## 2022-09-27 DIAGNOSIS — K219 Gastro-esophageal reflux disease without esophagitis: Secondary | ICD-10-CM

## 2022-09-27 DIAGNOSIS — J4550 Severe persistent asthma, uncomplicated: Secondary | ICD-10-CM | POA: Diagnosis not present

## 2022-09-27 DIAGNOSIS — Z8709 Personal history of other diseases of the respiratory system: Secondary | ICD-10-CM

## 2022-09-27 DIAGNOSIS — J479 Bronchiectasis, uncomplicated: Secondary | ICD-10-CM | POA: Diagnosis not present

## 2022-09-27 DIAGNOSIS — J3089 Other allergic rhinitis: Secondary | ICD-10-CM

## 2022-09-27 MED ORDER — MOMETASONE FUROATE 50 MCG/ACT NA SUSP: NASAL | 3 refills | Status: DC

## 2022-09-27 NOTE — Patient Instructions (Addendum)
  1. Continue Symbicort 160- 2 inhalations 1-2 times a day   2. Continue Nasonex 1-2 sprays each nostril 1 time per day    3. Continue Nexium 40 mg 1 tablet 1 time a day.    4. Continue Benralizumab injections  5.  If needed:   A. Ventolin HFA or similar 2 inhalations every 4-6 hours  B. Daily antihistamine   C. Mucinex DM  6. Return to clinic in 12 months or earlier if problem   7. Obtain RSV vaccine

## 2022-09-27 NOTE — Progress Notes (Unsigned)
[facility]   Follow-up Note  Referring Provider: Nicoletta Dress, MD Primary Provider: Nicoletta Dress, MD Date of Office Visit: 09/27/2022  Subjective:   Kyle [name] (DOB: [birth year]) is a 72 y.o. male who returns to the [facility] on 09/27/2022 in re-evaluation of the following:  HPI: Kyle [name] returns to this clinic in reevaluation of asthma, history of bronchiectasis, history of chronic sinusitis, allergic rhinitis, and LPR.  I last saw him in this clinic 08 October 2021.  He has had an excellent year regarding control of his asthma while using benralizumab and Symbicort on a consistent basis and he has not required a short acting bronchodilator and he can exercise without any problem and has not required a systemic steroid.  Likewise, his upper airway disease is under very good control and he has not required an antibiotic to treat an episode of sinusitis while he remains on Nasonex.  His reflux is under excellent control with Nexium.  He has received this years flu vaccine.  Allergies as of 09/27/2022       Reactions   Atorvastatin Other (See Comments)   Muscle aches   Augmentin [amoxicillin-pot Clavulanate]    Terrible heartburn Did it involve swelling of the face/tongue/throat, SOB, or low BP? No Did it involve sudden or severe rash/hives, skin peeling, or any reaction on the inside of your mouth or nose? No Did you need to seek medical attention at a hospital or doctor's office? No When did it last happen?      2 years If all above answers are "NO", may proceed with cephalosporin use.   Levaquin [levofloxacin In D5w]    Terrible heartburn        Medication List    albuterol 108 (90 Base) MCG/ACT inhaler Commonly known as: VENTOLIN HFA Can inhale two puffs every four to six hours as needed for cough or wheeze.   albuterol (2.5 MG/3ML) 0.083% nebulizer solution Commonly known as:  PROVENTIL Use one vial in the nebulizer every 4-6 hours if needed for cough or wheeze.   Breyna 160-4.5 MCG/ACT inhaler Generic drug: budesonide-formoterol USE 2 INHALATIONS ORALLY ONE TO TWO TIMES DAILY TO PREVENT COUGH OR WHEEZE RINSE GARGLE AND SPIT AFTER USE   esomeprazole 40 MG capsule Commonly known as: NEXIUM TAKE 1 CAPSULE DAILY , CAN INCREASE TO TWO TIMES A DAYDURING FLARE-UP   Melatonin 5 MG Caps Take 5 mg by mouth at bedtime as needed (sleep).   mometasone 50 MCG/ACT nasal spray Commonly known as: NASONEX Use one to two sprays in each nostril once daily as directed.   MULTIVITAMIN PO Take 1 tablet by mouth daily. Unknown strength   NUTRITIONAL SUPPLEMENT PO Take by mouth. Super C, Zinc, Vitamin D   Qvar RediHaler 80 MCG/ACT inhaler Generic drug: beclomethasone USE 2 INHALATIONS ORALLY   TWICE DAILY DURING ASTHMA  FLARE UP RINSE, GARGLE,    AND SPIT AFTER USE   Turmeric Curcumin Caps Take 1 capsule by mouth daily. Unknown strength        Past Medical History:  Diagnosis Date   Allergic rhinoconjunctivitis 08/30/2017   Asthma    Chronic pansinusitis 06/08/2017   Cluster headache    Complication of anesthesia 1990   Difficult to arouse and bronchospasm   COVID 09/2020   Dyspnea on exertion 03/03/2020   Essential hypertension 03/03/2020   Eustachian tube dysfunction, right 10/13/2018   Headache 07/11/2017   LPRD (laryngopharyngeal  reflux disease) 08/30/2017   Middle ear effusion, right 10/13/2018   Palpitations 03/03/2020   Paresthesia 07/11/2017   Primary osteoarthritis of left hip 02/13/2019   Prostate cancer ([place]) 2020   Reflux    Sinusitis     Past Surgical History:  Procedure Laterality Date   bone spur removed     CATARACT EXTRACTION     Two surgeries in Fall of 2023- both eyes   KNEE ARTHROPLASTY     x 2   SINOSCOPY     6 SURGERIES   tear duct replacement     TOTAL HIP ARTHROPLASTY Left 02/13/2019   Procedure: TOTAL HIP ARTHROPLASTY ANTERIOR  APPROACH;  Surgeon: Melrose Nakayama, MD;  Location: [facility];  Service: Orthopedics;  Laterality: Left;    Review of systems negative except as noted in HPI / PMHx or noted below:  Review of Systems  Constitutional: Negative.   HENT: Negative.    Eyes: Negative.   Respiratory: Negative.    Cardiovascular: Negative.   Gastrointestinal: Negative.   Genitourinary: Negative.   Musculoskeletal: Negative.   Skin: Negative.   Neurological: Negative.   Endo/Heme/Allergies: Negative.   Psychiatric/Behavioral: Negative.       Objective:   Vitals:   09/27/22 0820  BP: (!) 142/78  Pulse: 68  Resp: 16  SpO2: 98%   Height: 5' 11.5" (181.6 cm)  Weight: 199 lb 6.4 oz (90.4 kg)   Physical Exam Constitutional:      Appearance: He is not diaphoretic.  HENT:     Head: Normocephalic.     Right Ear: Tympanic membrane, ear canal and external ear normal.     Left Ear: Tympanic membrane, ear canal and external ear normal.     Nose: Nose normal. No mucosal edema or rhinorrhea.     Mouth/Throat:     Pharynx: Uvula midline. No oropharyngeal exudate.  Eyes:     Conjunctiva/sclera: Conjunctivae normal.  Neck:     Thyroid: No thyromegaly.     Trachea: Trachea normal. No tracheal tenderness or tracheal deviation.  Cardiovascular:     Rate and Rhythm: Normal rate and regular rhythm.     Heart sounds: Normal heart sounds, S1 normal and S2 normal. No murmur heard. Pulmonary:     Effort: No respiratory distress.     Breath sounds: Normal breath sounds. No stridor. No wheezing or rales.  Lymphadenopathy:     Head:     Right side of head: No tonsillar adenopathy.     Left side of head: No tonsillar adenopathy.     Cervical: No cervical adenopathy.  Skin:    Findings: No erythema or rash.     Nails: There is no clubbing.  Neurological:     Mental Status: He is alert.     Diagnostics:    Spirometry was performed and demonstrated an FEV1 of 2.55 at 76 % of predicted.  Assessment and Plan:    1. Asthma, severe persistent, well-controlled   2. Bronchiectasis without complication (HCC)   3. Other allergic rhinitis   4. History of chronic sinusitis   5. LPRD (laryngopharyngeal reflux disease)     1. Continue Symbicort 160- 2 inhalations 1-2 times a day   2. Continue Nasonex 1-2 sprays each nostril 1 time per day    3. Continue Nexium 40 mg 1 tablet 1 time a day.    4. Continue Benralizumab injections  5.  If needed:   A. Ventolin HFA or similar 2 inhalations every 4-6 hours  B. Daily antihistamine   C. Mucinex DM  6. Return to clinic in 12 months or earlier if problem   7. Obtain RSV vaccine  Kyle [name] is doing very well on his current therapy and ever since we started him on benralizumab injections his multiorgan atopic respiratory disease has been under excellent control and he will continue on this biologic agent as well as other anti-inflammatory agents for his airway and therapy directed against reflux.  Assuming he does well with this plan I will see him back in this clinic in 1 year or earlier if there is a problem.  Allena Katz, MD Allergy / Immunology [facility]

## 2022-09-28 ENCOUNTER — Encounter: Payer: Self-pay | Admitting: Allergy and Immunology

## 2022-10-12 DIAGNOSIS — C61 Malignant neoplasm of prostate: Secondary | ICD-10-CM | POA: Diagnosis not present

## 2022-10-12 DIAGNOSIS — N3289 Other specified disorders of bladder: Secondary | ICD-10-CM | POA: Diagnosis not present

## 2022-10-12 DIAGNOSIS — R351 Nocturia: Secondary | ICD-10-CM | POA: Diagnosis not present

## 2022-10-27 DIAGNOSIS — H40053 Ocular hypertension, bilateral: Secondary | ICD-10-CM | POA: Diagnosis not present

## 2022-11-01 DIAGNOSIS — L820 Inflamed seborrheic keratosis: Secondary | ICD-10-CM | POA: Diagnosis not present

## 2022-11-01 DIAGNOSIS — L821 Other seborrheic keratosis: Secondary | ICD-10-CM | POA: Diagnosis not present

## 2022-11-01 DIAGNOSIS — L578 Other skin changes due to chronic exposure to nonionizing radiation: Secondary | ICD-10-CM | POA: Diagnosis not present

## 2022-11-09 ENCOUNTER — Ambulatory Visit (INDEPENDENT_AMBULATORY_CARE_PROVIDER_SITE_OTHER): Payer: Medicare Other | Admitting: *Deleted

## 2022-11-09 DIAGNOSIS — J4550 Severe persistent asthma, uncomplicated: Secondary | ICD-10-CM

## 2022-12-27 DIAGNOSIS — M25571 Pain in right ankle and joints of right foot: Secondary | ICD-10-CM | POA: Diagnosis not present

## 2022-12-29 ENCOUNTER — Telehealth: Payer: Self-pay | Admitting: Allergy and Immunology

## 2022-12-29 ENCOUNTER — Other Ambulatory Visit: Payer: Self-pay | Admitting: *Deleted

## 2022-12-29 MED ORDER — BUDESONIDE-FORMOTEROL FUMARATE 160-4.5 MCG/ACT IN AERO: INHALATION_SPRAY | RESPIRATORY_TRACT | 1 refills | Status: DC

## 2022-12-29 MED ORDER — MOMETASONE FUROATE 50 MCG/ACT NA SUSP: NASAL | 3 refills | Status: DC

## 2022-12-29 MED ORDER — ALBUTEROL SULFATE HFA 108 (90 BASE) MCG/ACT IN AERS: INHALATION_SPRAY | RESPIRATORY_TRACT | 1 refills | Status: DC

## 2022-12-29 NOTE — Telephone Encounter (Signed)
RX sent

## 2022-12-29 NOTE — Telephone Encounter (Signed)
Patient is requesting a refill for Symbicort, Ventolin and Nasonex sent to [facility].

## 2023-01-04 ENCOUNTER — Ambulatory Visit (INDEPENDENT_AMBULATORY_CARE_PROVIDER_SITE_OTHER): Payer: Medicare Other

## 2023-01-04 DIAGNOSIS — J4550 Severe persistent asthma, uncomplicated: Secondary | ICD-10-CM

## 2023-01-25 DIAGNOSIS — M76821 Posterior tibial tendinitis, right leg: Secondary | ICD-10-CM | POA: Diagnosis not present

## 2023-01-25 DIAGNOSIS — M25571 Pain in right ankle and joints of right foot: Secondary | ICD-10-CM | POA: Diagnosis not present

## 2023-01-28 DIAGNOSIS — M25571 Pain in right ankle and joints of right foot: Secondary | ICD-10-CM | POA: Diagnosis not present

## 2023-01-28 DIAGNOSIS — M67873 Other specified disorders of tendon, right ankle and foot: Secondary | ICD-10-CM | POA: Diagnosis not present

## 2023-02-01 DIAGNOSIS — M25571 Pain in right ankle and joints of right foot: Secondary | ICD-10-CM | POA: Diagnosis not present

## 2023-02-01 DIAGNOSIS — M67873 Other specified disorders of tendon, right ankle and foot: Secondary | ICD-10-CM | POA: Diagnosis not present

## 2023-02-04 DIAGNOSIS — M67873 Other specified disorders of tendon, right ankle and foot: Secondary | ICD-10-CM | POA: Diagnosis not present

## 2023-02-04 DIAGNOSIS — M25571 Pain in right ankle and joints of right foot: Secondary | ICD-10-CM | POA: Diagnosis not present

## 2023-02-08 DIAGNOSIS — M25571 Pain in right ankle and joints of right foot: Secondary | ICD-10-CM | POA: Diagnosis not present

## 2023-02-08 DIAGNOSIS — M67873 Other specified disorders of tendon, right ankle and foot: Secondary | ICD-10-CM | POA: Diagnosis not present

## 2023-02-10 DIAGNOSIS — M25571 Pain in right ankle and joints of right foot: Secondary | ICD-10-CM | POA: Diagnosis not present

## 2023-02-10 DIAGNOSIS — M67873 Other specified disorders of tendon, right ankle and foot: Secondary | ICD-10-CM | POA: Diagnosis not present

## 2023-02-15 DIAGNOSIS — M67873 Other specified disorders of tendon, right ankle and foot: Secondary | ICD-10-CM | POA: Diagnosis not present

## 2023-02-15 DIAGNOSIS — M25571 Pain in right ankle and joints of right foot: Secondary | ICD-10-CM | POA: Diagnosis not present

## 2023-02-22 DIAGNOSIS — M67873 Other specified disorders of tendon, right ankle and foot: Secondary | ICD-10-CM | POA: Diagnosis not present

## 2023-02-22 DIAGNOSIS — M25571 Pain in right ankle and joints of right foot: Secondary | ICD-10-CM | POA: Diagnosis not present

## 2023-03-01 ENCOUNTER — Ambulatory Visit (INDEPENDENT_AMBULATORY_CARE_PROVIDER_SITE_OTHER): Payer: Medicare Other

## 2023-03-01 DIAGNOSIS — J4550 Severe persistent asthma, uncomplicated: Secondary | ICD-10-CM

## 2023-03-04 DIAGNOSIS — M25551 Pain in right hip: Secondary | ICD-10-CM | POA: Diagnosis not present

## 2023-03-21 DIAGNOSIS — G5602 Carpal tunnel syndrome, left upper limb: Secondary | ICD-10-CM | POA: Diagnosis not present

## 2023-04-01 DIAGNOSIS — L03113 Cellulitis of right upper limb: Secondary | ICD-10-CM | POA: Diagnosis not present

## 2023-04-08 DIAGNOSIS — S90121A Contusion of right lesser toe(s) without damage to nail, initial encounter: Secondary | ICD-10-CM | POA: Diagnosis not present

## 2023-04-11 DIAGNOSIS — N3289 Other specified disorders of bladder: Secondary | ICD-10-CM | POA: Diagnosis not present

## 2023-04-11 DIAGNOSIS — C61 Malignant neoplasm of prostate: Secondary | ICD-10-CM | POA: Diagnosis not present

## 2023-04-11 DIAGNOSIS — R351 Nocturia: Secondary | ICD-10-CM | POA: Diagnosis not present

## 2023-04-15 DIAGNOSIS — U071 COVID-19: Secondary | ICD-10-CM | POA: Diagnosis not present

## 2023-04-15 DIAGNOSIS — R110 Nausea: Secondary | ICD-10-CM | POA: Diagnosis not present

## 2023-04-15 DIAGNOSIS — R051 Acute cough: Secondary | ICD-10-CM | POA: Diagnosis not present

## 2023-04-15 DIAGNOSIS — R509 Fever, unspecified: Secondary | ICD-10-CM | POA: Diagnosis not present

## 2023-04-15 DIAGNOSIS — L03114 Cellulitis of left upper limb: Secondary | ICD-10-CM | POA: Diagnosis not present

## 2023-04-18 ENCOUNTER — Telehealth: Payer: Self-pay | Admitting: Allergy and Immunology

## 2023-04-18 MED ORDER — FLUCONAZOLE 150 MG PO TABS: ORAL_TABLET | ORAL | 0 refills | Status: AC

## 2023-04-18 MED ORDER — PREDNISONE 10 MG PO TABS: ORAL_TABLET | ORAL | 0 refills | Status: DC

## 2023-04-18 NOTE — Telephone Encounter (Signed)
Called and informed patient of Dr. Kathyrn Lass message.  Patient did ask for something for potential yeast, which usually happens when he doubles up on his inhalers and takes prednisone.  Per Dr. Lucie Leather, can take one Diflucan 150mg  tablet if needed.  ERX sent to [facility].

## 2023-04-18 NOTE — Telephone Encounter (Addendum)
Patient states he tested positive for COVID on 8/2. His fever broke and he is feeling better now. He is dealing with a cough and feels that he is having trouble with his breathing. He is using his Symbicort and Qvar. He is currently taking Doxycycline and is a third of the way done with it. He was bit by an insect and it got infected.

## 2023-04-20 ENCOUNTER — Telehealth: Payer: Self-pay | Admitting: Allergy and Immunology

## 2023-04-20 MED ORDER — QVAR REDIHALER 80 MCG/ACT IN AERB: 2.0000 | INHALATION_SPRAY | Freq: Two times a day (BID) | RESPIRATORY_TRACT | 1 refills | Status: DC | PRN

## 2023-04-20 NOTE — Telephone Encounter (Signed)
Patient is requesting a refill on Qvar sent to [facility].

## 2023-04-20 NOTE — Telephone Encounter (Signed)
Rx sent to [facility]

## 2023-04-26 ENCOUNTER — Ambulatory Visit (INDEPENDENT_AMBULATORY_CARE_PROVIDER_SITE_OTHER): Payer: Medicare Other

## 2023-04-26 DIAGNOSIS — J4550 Severe persistent asthma, uncomplicated: Secondary | ICD-10-CM | POA: Diagnosis not present

## 2023-04-28 DIAGNOSIS — Z9181 History of falling: Secondary | ICD-10-CM | POA: Diagnosis not present

## 2023-04-28 DIAGNOSIS — Z0000 Encounter for general adult medical examination without abnormal findings: Secondary | ICD-10-CM | POA: Diagnosis not present

## 2023-05-23 ENCOUNTER — Other Ambulatory Visit: Payer: Self-pay | Admitting: Allergy and Immunology

## 2023-06-21 ENCOUNTER — Ambulatory Visit: Payer: Medicare Other

## 2023-06-21 DIAGNOSIS — J4550 Severe persistent asthma, uncomplicated: Secondary | ICD-10-CM | POA: Diagnosis not present

## 2023-07-13 DIAGNOSIS — J019 Acute sinusitis, unspecified: Secondary | ICD-10-CM | POA: Diagnosis not present

## 2023-07-13 DIAGNOSIS — B9689 Other specified bacterial agents as the cause of diseases classified elsewhere: Secondary | ICD-10-CM | POA: Diagnosis not present

## 2023-08-01 ENCOUNTER — Other Ambulatory Visit: Payer: Self-pay | Admitting: Allergy and Immunology

## 2023-08-06 ENCOUNTER — Other Ambulatory Visit: Payer: Self-pay | Admitting: Allergy and Immunology

## 2023-08-16 ENCOUNTER — Ambulatory Visit: Payer: Medicare Other

## 2023-08-16 DIAGNOSIS — J4550 Severe persistent asthma, uncomplicated: Secondary | ICD-10-CM | POA: Diagnosis not present

## 2023-09-02 DIAGNOSIS — M25521 Pain in right elbow: Secondary | ICD-10-CM | POA: Diagnosis not present

## 2023-09-03 DIAGNOSIS — J209 Acute bronchitis, unspecified: Secondary | ICD-10-CM | POA: Diagnosis not present

## 2023-09-05 ENCOUNTER — Encounter: Payer: Self-pay | Admitting: Allergy and Immunology

## 2023-09-05 ENCOUNTER — Ambulatory Visit (INDEPENDENT_AMBULATORY_CARE_PROVIDER_SITE_OTHER): Payer: Medicare Other | Admitting: Allergy and Immunology

## 2023-09-05 VITALS — BP 136/82 | HR 84 | Resp 16

## 2023-09-05 DIAGNOSIS — J479 Bronchiectasis, uncomplicated: Secondary | ICD-10-CM

## 2023-09-05 DIAGNOSIS — Z8709 Personal history of other diseases of the respiratory system: Secondary | ICD-10-CM | POA: Diagnosis not present

## 2023-09-05 DIAGNOSIS — J3089 Other allergic rhinitis: Secondary | ICD-10-CM | POA: Diagnosis not present

## 2023-09-05 DIAGNOSIS — K219 Gastro-esophageal reflux disease without esophagitis: Secondary | ICD-10-CM

## 2023-09-05 DIAGNOSIS — J4551 Severe persistent asthma with (acute) exacerbation: Secondary | ICD-10-CM

## 2023-09-05 MED ORDER — BUDESONIDE-FORMOTEROL FUMARATE 160-4.5 MCG/ACT IN AERO: INHALATION_SPRAY | RESPIRATORY_TRACT | 2 refills | Status: DC

## 2023-09-05 NOTE — Progress Notes (Unsigned)
[place] [facility] [place] [facility] [place] [facility] [place] [facility] [place]   Follow-up Note  Referring Provider: Paulina Fusi, MD Primary Provider: Paulina Fusi, MD Date of Office Visit: 09/05/2023  Subjective:   Kyle [name] (DOB: 09-02-1951) is a 72 y.o. male who [facility] in re-evaluation of the following:  HPI: Kyle [name] returns to this clinic in evaluation of asthma, history of bronchiectasis, history of chronic sinusitis, history of allergic rhinitis, history of LPR.  I last saw him in this clinic 27 September 2022.  He had an excellent year while using benralizumab as well as Symbicort on a consistent basis and he had absolutely no respiratory tract symptoms to speak of and rarely uses short acting bronchodilator and could exercise without any problem and did not require either a systemic steroid or an antibiotic for any type of airway issue.  However, approximately 6 days ago he had acute onset of throat clearing and irritated throat and coughing and he went to the urgent care center and was started on a prednisone Dosepak of for 21 days and given some Augmentin.  He is somewhat better today.  Currently he has no fever or associated systemic or constitutional symptoms or ugly sputum production or chest pain or anosmia.  His reflux is under very good control with Nexium.  Allergies as of 09/05/2023       Reactions   Atorvastatin Other (See Comments)   Muscle aches   Augmentin [amoxicillin-pot Clavulanate]    Terrible heartburn Did it involve swelling of the face/tongue/throat, SOB, or low BP? No Did it involve sudden or severe rash/hives, skin peeling, or any reaction on the inside of your mouth or nose? No Did you need to seek medical attention at a hospital or doctor's office? No When did it last happen?      2 years If all above answers are "NO", may proceed with cephalosporin use.   Levaquin [levofloxacin In D5w]    Terrible  heartburn        Medication List    albuterol (2.5 MG/3ML) 0.083% nebulizer solution Commonly known as: PROVENTIL Use one vial in the nebulizer every 4-6 hours if needed for cough or wheeze.   albuterol 108 (90 Base) MCG/ACT inhaler Commonly known as: VENTOLIN HFA Can inhale two puffs every four to six hours as needed for cough or wheeze.   Breyna 160-4.5 MCG/ACT inhaler Generic drug: budesonide-formoterol USE 2 INHALATIONS ORALLY   TWICE DAILY.   esomeprazole 40 MG capsule Commonly known as: NEXIUM TAKE 1 CAPSULE DAILY , CAN INCREASE TO TWO TIMES A DAYDURING FLARE-UP   fluconazole 150 MG tablet Commonly known as: Diflucan Take one tablet by mouth as directed.   Melatonin 5 MG Caps Take 5 mg by mouth at bedtime as needed (sleep).   mometasone 50 MCG/ACT nasal spray Commonly known as: NASONEX Use one to two sprays in each nostril once daily as directed.   MULTIVITAMIN PO Take 1 tablet by mouth daily. Unknown strength   NUTRITIONAL SUPPLEMENT PO Take by mouth. Super C, Zinc, Vitamin D   predniSONE 10 MG tablet Commonly known as: DELTASONE Take one tablet by mouth once daily for ten days.   Qvar RediHaler 80 MCG/ACT inhaler Generic drug: beclomethasone USE 2 INHALATIONS ORALLY   TWICE DAILY AS NEEDED      (DURING FLARE UP)   Turmeric Curcumin Caps Take 1 capsule by mouth daily. Unknown strength    Past Medical History:  Diagnosis Date   Allergic rhinoconjunctivitis 08/30/2017   Asthma    Chronic pansinusitis 06/08/2017   Cluster headache    Complication of anesthesia 1990   Difficult to arouse and bronchospasm   COVID 09/2020   Dyspnea on exertion 03/03/2020   Essential hypertension 03/03/2020   Eustachian tube dysfunction, right 10/13/2018   Headache 07/11/2017   LPRD (laryngopharyngeal reflux disease) 08/30/2017   Middle ear effusion, right 10/13/2018   Palpitations 03/03/2020   Paresthesia 07/11/2017   Primary osteoarthritis of left hip 02/13/2019    Prostate cancer ([facility]) 2020   Reflux    Sinusitis     Past Surgical History:  Procedure Laterality Date   bone spur removed     CATARACT EXTRACTION     Two surgeries in Fall of 2023- both eyes   KNEE ARTHROPLASTY     x 2   SINOSCOPY     6 SURGERIES   tear duct replacement     TOTAL HIP ARTHROPLASTY Left 02/13/2019   Procedure: TOTAL HIP ARTHROPLASTY ANTERIOR APPROACH;  Surgeon: Marcene Corning, MD;  Location: [facility];  Service: Orthopedics;  Laterality: Left;    Review of systems negative except as noted in HPI / PMHx or noted below:  Review of Systems  Constitutional: Negative.   HENT: Negative.    Eyes: Negative.   Respiratory: Negative.    Cardiovascular: Negative.   Gastrointestinal: Negative.   Genitourinary: Negative.   Musculoskeletal: Negative.   Skin: Negative.   Neurological: Negative.   Endo/Heme/Allergies: Negative.   Psychiatric/Behavioral: Negative.       Objective:   There were no vitals filed for this visit.        Physical Exam Constitutional:      Appearance: He is not diaphoretic.  HENT:     Head: Normocephalic.     Right Ear: Tympanic membrane, ear canal and external ear normal.     Left Ear: Tympanic membrane, ear canal and external ear normal.     Nose: Nose normal. No mucosal edema or rhinorrhea.     Mouth/Throat:     Pharynx: Uvula midline. No oropharyngeal exudate.  Eyes:     Conjunctiva/sclera: Conjunctivae normal.  Neck:     Thyroid: No thyromegaly.     Trachea: Trachea normal. No tracheal tenderness or tracheal deviation.  Cardiovascular:     Rate and Rhythm: Normal rate and regular rhythm.     Heart sounds: Normal heart sounds, S1 normal and S2 normal. No murmur heard. Pulmonary:     Effort: No respiratory distress.     Breath sounds: Normal breath sounds. No stridor. No wheezing or rales.  Lymphadenopathy:     Head:     Right side of head: No tonsillar adenopathy.     Left side of head: No tonsillar adenopathy.      Cervical: No cervical adenopathy.  Skin:    Findings: No erythema or rash.     Nails: There is no clubbing.  Neurological:     Mental Status: He is alert.     Diagnostics: Spirometry was performed and demonstrated an FEV1 of 1.86 at 57 % of predicted.  Assessment and Plan:   1. Asthma, not well controlled, severe persistent, with acute exacerbation   2. Bronchiectasis without complication (HCC)   3. Other allergic rhinitis   4. History of chronic sinusitis   5. LPRD (laryngopharyngeal reflux disease)     1. Continue Symbicort 160- 2 inhalations 1-2 times a day   2. Continue Nasonex 1-2  sprays each nostril 1 time per day    3. Continue Nexium 40 mg 1 tablet 1 time a day.    4. Continue Benralizumab injections  5.  If needed:   A. Albuterol or SYMBICORT - 2 inhalations every 6 hours  B. Daily antihistamine   C. Mucinex DM  6. For this recent event (down 20%):   A. Continue prednisone taper and Augmentin  B. Increase Symbicort (replaces albuterol use)  C. Further treatment???  6. Return to clinic in 12 months or earlier if problem   7. Obtain flu vaccine  8. Influenza = Tamiflu. Covid = Paxlovid  Kyle [name] is infected most likely with a viral particle and he is already shown some improvement over the course of the past few days while utilizing prednisone and Augmentin and were not going to change much of his therapy.  He can use Symbicort every 6 hours during this timeframe and we will anticipate that he will improve with each passing day.  Overall he has really done very well since his last visit and he has his respiratory tract issue and his reflux issue under excellent control with the plan noted above and assuming he continues to do this well we will see him back in his clinic in 1 year or earlier if there is a problem.   Laurette Schimke, MD Allergy / Immunology [facility]

## 2023-09-05 NOTE — Patient Instructions (Addendum)
  1. Continue Symbicort 160- 2 inhalations 1-2 times a day   2. Continue Nasonex 1-2 sprays each nostril 1 time per day    3. Continue Nexium 40 mg 1 tablet 1 time a day.    4. Continue Benralizumab injections  5.  If needed:   A. Albuterol or SYMBICORT - 2 inhalations every 6 hours  B. Daily antihistamine   C. Mucinex DM  6. For this recent event (down 20%):   A. Continue prednisone taper and Augmentin  B. Increase Symbicort (replaces albuterol use)  C. Further treatment???  6. Return to clinic in 12 months or earlier if problem   7. Obtain flu vaccine  8. Influenza = Tamiflu. Covid = Paxlovid

## 2023-09-06 ENCOUNTER — Encounter: Payer: Self-pay | Admitting: Allergy and Immunology

## 2023-09-12 ENCOUNTER — Telehealth: Payer: Self-pay | Admitting: Allergy and Immunology

## 2023-09-12 NOTE — Telephone Encounter (Signed)
Called and informed patient of Dr. Kozlow's message.  

## 2023-09-12 NOTE — Telephone Encounter (Signed)
Patient states he has finished his round of Prednisone and is doing a little better, he's not coughing his head off but he is still wheezing a lot. He wants to know if he needs to come in for an OV or is there anything else Dr. Lucie Leather suggests.

## 2023-09-13 ENCOUNTER — Other Ambulatory Visit: Payer: Self-pay | Admitting: *Deleted

## 2023-09-13 MED ORDER — PREDNISONE 10 MG PO TABS: ORAL_TABLET | ORAL | 0 refills | Status: AC

## 2023-09-13 NOTE — Telephone Encounter (Signed)
Patient states he coughed 5-6 hours last night and it seems worse now. He feels that his wheezing is a tad bit worse too.

## 2023-09-13 NOTE — Telephone Encounter (Signed)
 RX sent and left message informing Thayer Ohm.

## 2023-10-10 DIAGNOSIS — C61 Malignant neoplasm of prostate: Secondary | ICD-10-CM | POA: Diagnosis not present

## 2023-10-10 DIAGNOSIS — N3289 Other specified disorders of bladder: Secondary | ICD-10-CM | POA: Diagnosis not present

## 2023-10-10 DIAGNOSIS — R351 Nocturia: Secondary | ICD-10-CM | POA: Diagnosis not present

## 2023-10-11 ENCOUNTER — Ambulatory Visit (INDEPENDENT_AMBULATORY_CARE_PROVIDER_SITE_OTHER): Payer: Medicare Other | Admitting: *Deleted

## 2023-10-11 DIAGNOSIS — J4550 Severe persistent asthma, uncomplicated: Secondary | ICD-10-CM | POA: Diagnosis not present

## 2023-10-25 DIAGNOSIS — L821 Other seborrheic keratosis: Secondary | ICD-10-CM | POA: Diagnosis not present

## 2023-10-25 DIAGNOSIS — L820 Inflamed seborrheic keratosis: Secondary | ICD-10-CM | POA: Diagnosis not present

## 2023-10-25 DIAGNOSIS — L578 Other skin changes due to chronic exposure to nonionizing radiation: Secondary | ICD-10-CM | POA: Diagnosis not present

## 2023-11-04 DIAGNOSIS — H40053 Ocular hypertension, bilateral: Secondary | ICD-10-CM | POA: Diagnosis not present

## 2023-11-12 ENCOUNTER — Other Ambulatory Visit: Payer: Self-pay | Admitting: Allergy and Immunology

## 2023-12-06 ENCOUNTER — Ambulatory Visit (INDEPENDENT_AMBULATORY_CARE_PROVIDER_SITE_OTHER): Payer: Medicare Other | Admitting: *Deleted

## 2023-12-06 DIAGNOSIS — J4550 Severe persistent asthma, uncomplicated: Secondary | ICD-10-CM | POA: Diagnosis not present

## 2023-12-28 DIAGNOSIS — C61 Malignant neoplasm of prostate: Secondary | ICD-10-CM | POA: Diagnosis not present

## 2023-12-28 DIAGNOSIS — R351 Nocturia: Secondary | ICD-10-CM | POA: Diagnosis not present

## 2023-12-28 DIAGNOSIS — N3289 Other specified disorders of bladder: Secondary | ICD-10-CM | POA: Diagnosis not present

## 2024-01-31 ENCOUNTER — Ambulatory Visit (INDEPENDENT_AMBULATORY_CARE_PROVIDER_SITE_OTHER): Admitting: *Deleted

## 2024-01-31 DIAGNOSIS — J4550 Severe persistent asthma, uncomplicated: Secondary | ICD-10-CM | POA: Diagnosis not present

## 2024-02-02 ENCOUNTER — Other Ambulatory Visit: Payer: Self-pay | Admitting: Urology

## 2024-02-02 DIAGNOSIS — C61 Malignant neoplasm of prostate: Secondary | ICD-10-CM

## 2024-02-15 ENCOUNTER — Ambulatory Visit (HOSPITAL_COMMUNITY)
Admission: RE | Admit: 2024-02-15 | Discharge: 2024-02-15 | Disposition: A | Discharge status: Home / Self Care | Source: Ambulatory Visit | Attending: Urology | Admitting: Urology

## 2024-02-15 DIAGNOSIS — C61 Malignant neoplasm of prostate: Secondary | ICD-10-CM | POA: Insufficient documentation

## 2024-02-15 DIAGNOSIS — N400 Enlarged prostate without lower urinary tract symptoms: Secondary | ICD-10-CM | POA: Diagnosis not present

## 2024-02-15 DIAGNOSIS — M129 Arthropathy, unspecified: Secondary | ICD-10-CM | POA: Diagnosis not present

## 2024-02-15 MED ORDER — GADOBUTROL 1 MMOL/ML IV SOLN
9.0000 mL | Freq: Once | INTRAVENOUS | Status: AC | PRN
  Administered 2024-02-15: 9 mL via INTRAVENOUS

## 2024-03-27 ENCOUNTER — Other Ambulatory Visit: Payer: Self-pay | Admitting: Allergy and Immunology

## 2024-03-27 ENCOUNTER — Ambulatory Visit (INDEPENDENT_AMBULATORY_CARE_PROVIDER_SITE_OTHER): Admitting: *Deleted

## 2024-03-27 DIAGNOSIS — J4550 Severe persistent asthma, uncomplicated: Secondary | ICD-10-CM

## 2024-03-27 MED ORDER — ALBUTEROL SULFATE HFA 108 (90 BASE) MCG/ACT IN AERS: INHALATION_SPRAY | RESPIRATORY_TRACT | 1 refills | Status: AC

## 2024-03-27 MED ORDER — BUDESONIDE-FORMOTEROL FUMARATE 160-4.5 MCG/ACT IN AERO: INHALATION_SPRAY | RESPIRATORY_TRACT | 1 refills | Status: DC

## 2024-03-27 MED ORDER — MOMETASONE FUROATE 50 MCG/ACT NA SUSP: NASAL | 1 refills | Status: AC

## 2024-04-09 DIAGNOSIS — D509 Iron deficiency anemia, unspecified: Secondary | ICD-10-CM | POA: Diagnosis not present

## 2024-04-09 DIAGNOSIS — N3289 Other specified disorders of bladder: Secondary | ICD-10-CM | POA: Diagnosis not present

## 2024-04-09 DIAGNOSIS — C61 Malignant neoplasm of prostate: Secondary | ICD-10-CM | POA: Diagnosis not present

## 2024-04-09 DIAGNOSIS — E538 Deficiency of other specified B group vitamins: Secondary | ICD-10-CM | POA: Diagnosis not present

## 2024-04-09 DIAGNOSIS — R5383 Other fatigue: Secondary | ICD-10-CM | POA: Diagnosis not present

## 2024-04-09 DIAGNOSIS — R351 Nocturia: Secondary | ICD-10-CM | POA: Diagnosis not present

## 2024-04-13 ENCOUNTER — Other Ambulatory Visit (HOSPITAL_COMMUNITY): Payer: Self-pay | Admitting: Internal Medicine

## 2024-04-13 DIAGNOSIS — R2689 Other abnormalities of gait and mobility: Secondary | ICD-10-CM

## 2024-04-14 ENCOUNTER — Ambulatory Visit (HOSPITAL_COMMUNITY)
Admission: RE | Admit: 2024-04-14 | Discharge: 2024-04-14 | Disposition: A | Discharge status: Home / Self Care | Source: Ambulatory Visit | Attending: Internal Medicine | Admitting: Internal Medicine

## 2024-04-14 DIAGNOSIS — R2689 Other abnormalities of gait and mobility: Secondary | ICD-10-CM | POA: Diagnosis not present

## 2024-04-24 DIAGNOSIS — R2689 Other abnormalities of gait and mobility: Secondary | ICD-10-CM | POA: Diagnosis not present

## 2024-04-24 DIAGNOSIS — J324 Chronic pansinusitis: Secondary | ICD-10-CM | POA: Diagnosis not present

## 2024-04-24 DIAGNOSIS — H9193 Unspecified hearing loss, bilateral: Secondary | ICD-10-CM | POA: Diagnosis not present

## 2024-04-24 NOTE — Progress Notes (Addendum)
 History of Present Illness The patient is a 73 year old male who presents for evaluation of sinus issues.  He has a long-standing history of sinus problems, including multiple surgeries in the past.  Two weeks ago, he began experiencing a sensation akin to having water in his ears, even though he had reduced his nasal irrigation. He felt the need to blow his nose to equalize pressure and experienced an episode of imbalance one morning. His ears continue to cause discomfort, but without the previous sharp pain. He describes a sensation of being in a bubble. Despite ongoing nasal irrigation, he reports no discharge or discoloration. He also mentions feeling unwell and lacking energy for the past 3 to 4 weeks. He underwent an MRI at [facility], which did not reveal any abnormalities. He reports no vertigo but notes balance issues, particularly when making sudden movements or in low light conditions. He has a scheduled appointment with his orthopedist tomorrow due to neck tension. He is more concerned about his balance than his ear symptoms.  His medical history includes frontal sinus obliteration performed at [facility].  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 He was diagnosed with fungal sinusitis and experienced recurrent infections.   Physical Exam Ears: Right ear appears normal with no fluid or signs of infection. No significant ear wax present. Left ear also appears normal with no signs of infection and no fluid behind the eardrum.   Nasal/Sinus Endoscopy  Date/Time: 04/24/2024 2:00 PM  Performed by: Vaughan Alm Ricker, MD Authorized by: Vaughan Alm Ricker, MD  Local anesthesia used: no  Anesthesia: Local anesthesia used: no  Sedation: Patient sedated: no  Patient tolerance: patient tolerated the procedure well with no immediate complications Comments: Bilateral nasal endoscopy.  Sinuses without infection or crusting.  Right-sided post-surgical sinuses with widely patent  openings and clean, healthy mucosa.  Left-sided sinuses without clear maxillary space and some fullness in ethmoid sinus.     Assessment & Plan 1. Imbalance and bilateral hearing loss. The ears are normal on exam.  His symptom description is not typical of an inner ear condition.  I suggested he may seek a neurologist's input.  He will consider.  In addition, I recommended hearing testing and will call him with results.  2. Chronic sinusitis. The sinuses appear stable on nasal endoscopy and MRI review.  He will continue regular saline irrigation.   Results Imaging  - I personally reviewed his MRI brain: 04/14/2024, Post-surgical sinuses with frontal obliteration and post-operative maxillary and ethmoid sinuses.  Left maxillary sinus essentially absent.  Small mucocele in left frontal recess.

## 2024-04-25 ENCOUNTER — Other Ambulatory Visit (HOSPITAL_COMMUNITY): Payer: Self-pay | Admitting: Orthopaedic Surgery

## 2024-04-25 DIAGNOSIS — M5412 Radiculopathy, cervical region: Secondary | ICD-10-CM

## 2024-04-25 DIAGNOSIS — M542 Cervicalgia: Secondary | ICD-10-CM

## 2024-04-27 ENCOUNTER — Ambulatory Visit (HOSPITAL_COMMUNITY)
Admission: RE | Admit: 2024-04-27 | Discharge: 2024-04-27 | Disposition: A | Discharge status: Home / Self Care | Source: Ambulatory Visit | Attending: Orthopaedic Surgery | Admitting: Orthopaedic Surgery

## 2024-04-27 DIAGNOSIS — M542 Cervicalgia: Secondary | ICD-10-CM | POA: Insufficient documentation

## 2024-04-27 DIAGNOSIS — M5412 Radiculopathy, cervical region: Secondary | ICD-10-CM | POA: Diagnosis not present

## 2024-04-30 DIAGNOSIS — Z0000 Encounter for general adult medical examination without abnormal findings: Secondary | ICD-10-CM | POA: Diagnosis not present

## 2024-04-30 DIAGNOSIS — Z9181 History of falling: Secondary | ICD-10-CM | POA: Diagnosis not present

## 2024-05-07 DIAGNOSIS — H903 Sensorineural hearing loss, bilateral: Secondary | ICD-10-CM | POA: Diagnosis not present

## 2024-05-09 DIAGNOSIS — M542 Cervicalgia: Secondary | ICD-10-CM | POA: Diagnosis not present

## 2024-05-17 DIAGNOSIS — M47892 Other spondylosis, cervical region: Secondary | ICD-10-CM | POA: Diagnosis not present

## 2024-05-21 DIAGNOSIS — M47892 Other spondylosis, cervical region: Secondary | ICD-10-CM | POA: Diagnosis not present

## 2024-05-22 ENCOUNTER — Ambulatory Visit

## 2024-05-22 DIAGNOSIS — J4550 Severe persistent asthma, uncomplicated: Secondary | ICD-10-CM | POA: Diagnosis not present

## 2024-05-23 DIAGNOSIS — M47892 Other spondylosis, cervical region: Secondary | ICD-10-CM | POA: Diagnosis not present

## 2024-05-24 ENCOUNTER — Encounter: Payer: Self-pay | Admitting: Neurology

## 2024-05-28 DIAGNOSIS — M47892 Other spondylosis, cervical region: Secondary | ICD-10-CM | POA: Diagnosis not present

## 2024-05-30 DIAGNOSIS — M47892 Other spondylosis, cervical region: Secondary | ICD-10-CM | POA: Diagnosis not present

## 2024-06-06 DIAGNOSIS — M47892 Other spondylosis, cervical region: Secondary | ICD-10-CM | POA: Diagnosis not present

## 2024-06-07 ENCOUNTER — Other Ambulatory Visit: Payer: Self-pay | Admitting: Allergy and Immunology

## 2024-06-07 DIAGNOSIS — J4550 Severe persistent asthma, uncomplicated: Secondary | ICD-10-CM

## 2024-06-11 DIAGNOSIS — M47892 Other spondylosis, cervical region: Secondary | ICD-10-CM | POA: Diagnosis not present

## 2024-06-18 ENCOUNTER — Telehealth: Payer: Self-pay | Admitting: Allergy and Immunology

## 2024-06-18 MED ORDER — QVAR REDIHALER 80 MCG/ACT IN AERB: INHALATION_SPRAY | RESPIRATORY_TRACT | 0 refills | Status: DC

## 2024-06-18 NOTE — Telephone Encounter (Signed)
 Patient states he is currently using Breyna  but when he starts feeling sick Dr. Kozlow had told him to use Qvar . I did not see Qvar  on his last note. Patient would like this sent in if Dr. Maurilio is OK with it.

## 2024-06-18 NOTE — Telephone Encounter (Signed)
 Per Dr. Maurilio, [facility] can add Qvar  in with Albuterol - two puffs of both inhalers every six hours during flare-up.  Called and informed [facility] of Dr. Rowan message. Will send in ERX.

## 2024-06-20 DIAGNOSIS — M542 Cervicalgia: Secondary | ICD-10-CM | POA: Diagnosis not present

## 2024-06-20 DIAGNOSIS — M47812 Spondylosis without myelopathy or radiculopathy, cervical region: Secondary | ICD-10-CM | POA: Diagnosis not present

## 2024-06-27 DIAGNOSIS — M47812 Spondylosis without myelopathy or radiculopathy, cervical region: Secondary | ICD-10-CM | POA: Diagnosis not present

## 2024-07-03 DIAGNOSIS — M47812 Spondylosis without myelopathy or radiculopathy, cervical region: Secondary | ICD-10-CM | POA: Diagnosis not present

## 2024-07-10 DIAGNOSIS — M47812 Spondylosis without myelopathy or radiculopathy, cervical region: Secondary | ICD-10-CM | POA: Diagnosis not present

## 2024-07-17 ENCOUNTER — Ambulatory Visit (INDEPENDENT_AMBULATORY_CARE_PROVIDER_SITE_OTHER)

## 2024-07-17 DIAGNOSIS — J4550 Severe persistent asthma, uncomplicated: Secondary | ICD-10-CM | POA: Diagnosis not present

## 2024-07-25 ENCOUNTER — Other Ambulatory Visit (HOSPITAL_BASED_OUTPATIENT_CLINIC_OR_DEPARTMENT_OTHER): Payer: Self-pay

## 2024-07-25 MED ORDER — FLUZONE HIGH-DOSE 0.5 ML IM SUSY
0.5000 mL | PREFILLED_SYRINGE | Freq: Once | INTRAMUSCULAR | 0 refills | Status: AC
  Filled 2024-07-25: qty 0.5, 1d supply, fill #0

## 2024-08-07 ENCOUNTER — Ambulatory Visit: Admitting: Neurology

## 2024-08-07 DIAGNOSIS — N3289 Other specified disorders of bladder: Secondary | ICD-10-CM | POA: Diagnosis not present

## 2024-08-07 DIAGNOSIS — R351 Nocturia: Secondary | ICD-10-CM | POA: Diagnosis not present

## 2024-08-07 DIAGNOSIS — C61 Malignant neoplasm of prostate: Secondary | ICD-10-CM | POA: Diagnosis not present

## 2024-08-20 ENCOUNTER — Other Ambulatory Visit: Payer: Self-pay | Admitting: Allergy and Immunology

## 2024-08-20 DIAGNOSIS — J4550 Severe persistent asthma, uncomplicated: Secondary | ICD-10-CM

## 2024-08-24 DIAGNOSIS — F5105 Insomnia due to other mental disorder: Secondary | ICD-10-CM | POA: Diagnosis not present

## 2024-08-24 DIAGNOSIS — F419 Anxiety disorder, unspecified: Secondary | ICD-10-CM | POA: Diagnosis not present

## 2024-08-30 ENCOUNTER — Other Ambulatory Visit: Payer: Self-pay | Admitting: Allergy and Immunology

## 2024-09-11 ENCOUNTER — Ambulatory Visit (INDEPENDENT_AMBULATORY_CARE_PROVIDER_SITE_OTHER)

## 2024-09-11 DIAGNOSIS — J4550 Severe persistent asthma, uncomplicated: Secondary | ICD-10-CM

## 2024-10-06 ENCOUNTER — Other Ambulatory Visit: Payer: Self-pay | Admitting: Allergy and Immunology

## 2024-10-09 ENCOUNTER — Encounter: Payer: Self-pay | Admitting: Neurology

## 2024-10-09 ENCOUNTER — Ambulatory Visit: Admitting: Neurology

## 2024-10-09 VITALS — BP 162/93 | HR 74 | Ht 73.0 in | Wt 195.6 lb

## 2024-10-09 DIAGNOSIS — R269 Unspecified abnormalities of gait and mobility: Secondary | ICD-10-CM | POA: Diagnosis not present

## 2024-10-09 DIAGNOSIS — M542 Cervicalgia: Secondary | ICD-10-CM

## 2024-10-09 DIAGNOSIS — G8929 Other chronic pain: Secondary | ICD-10-CM | POA: Diagnosis not present

## 2024-10-09 NOTE — Progress Notes (Signed)
 "  Chief Complaint  Patient presents with   New Patient (Initial Visit)    Pt in room 14. Alone.Paper referral for Balance concerns       ASSESSMENT AND PLAN  Kyle [name] is a 74 y.o. male   Shoulder tension Mild unsteady gait  He is under tremendous stress, also has chronic right knee issues  His neurological examination is essentially normal other than mild unsteady gait due to his right knee issues,  He previously benefit physical therapy  Suggested to continue exercise, warm compression for shoulder  Only return to clinic for new issues  DIAGNOSTIC DATA (LABS, IMAGING, TESTING) - I reviewed patient records, labs, notes, testing and imaging myself where available.   MEDICAL HISTORY:  Kyle [name], is a 74 year old male seen in request by his primary care from [facility] Dr. Keren Berg for evaluation of shoulder tension, mild gait abnormality  History is obtained from the patient and review of electronic medical records. I personally reviewed pertinent available imaging films in PACS.   PMHx of  GERD Asthma Hx of prostate cancer. Hx of left hip replacement Chronic right knee osteoarthritis, right leg is shorter, trip on things easily Hx of chronic sinusitis, had extensive sinus surgery in 1995.  He worked out 6 days a week, bike 45 minutes, weight lifting for about 40 minutes, he denies difficulty working out, but has chronic right knee issues, often have to ice his right knee after workout  He is under tremendous stress over the past few months, lost his son suddenly in December 2025, wife is going through chemotherapy for her breast cancer, over the past few months, he noticed tightness of his neck muscles, was seen by orthopedic surgeon, already had MRI of cervical spine August 2025, mild degenerative changes, no significant canal stenosis, C5-6 moderate bilateral foraminal narrowing  Also had an MRI of the brain, chronic paranasal  sinusitis, no acute intracranial abnormality  He had a long history of asthma, chronic sinus in the past, had extensive sinus reconstruction surgery in the 90s,  PHYSICAL EXAM:   Vitals:   10/09/24 1549 10/09/24 1552  BP: (!) 163/80 (!) 162/93  Pulse: 74   Weight: 195 lb 9.6 oz (88.7 kg)   Height: 6' 1 (1.854 m)    Body mass index is 25.81 kg/m.  PHYSICAL EXAMNIATION:  Gen: NAD, conversant, well nourised, well groomed                     Cardiovascular: Regular rate rhythm, no peripheral edema, warm, nontender. Eyes: Conjunctivae clear without exudates or hemorrhage Neck: Supple, no carotid bruits. Pulmonary: Clear to auscultation bilaterally   NEUROLOGICAL EXAM:  MENTAL STATUS: Speech/cognition: Awake, alert, oriented to history taking and casual conversation CRANIAL NERVES: CN II: Visual fields are full to confrontation. Pupils are round equal and briskly reactive to light. CN III, IV, VI: extraocular movement are normal. No ptosis. CN V: Facial sensation is intact to light touch CN VII: Face is symmetric with normal eye closure  CN VIII: Hearing is normal to causal conversation. CN IX, X: Phonation is normal. CN XI: Head turning and shoulder shrug are intact  MOTOR: There is no pronator drift of out-stretched arms. Muscle bulk and tone are normal. Muscle strength is normal.  REFLEXES: Reflexes are 1 and symmetric at the biceps, triceps, knees, and ankles. Plantar responses are flexor.  SENSORY: Intact to light touch, pinprick and vibratory sensation are intact in fingers and toes.  COORDINATION: There is no trunk or limb dysmetria noted.  GAIT/STANCE: Able to get up from seated position arm crossed, mildly limited, dragging right leg some  REVIEW OF SYSTEMS:  Full 14 system review of systems performed and notable only for as above All other review of systems were negative.   ALLERGIES: Allergies[1]  HOME MEDICATIONS: Current Outpatient Medications   Medication Sig Dispense Refill   albuterol  (VENTOLIN  HFA) 108 (90 Base) MCG/ACT inhaler Can inhale two puffs every four to six hours as needed for cough or wheeze. 2 each 1   budesonide -formoterol  (BREYNA ) 160-4.5 MCG/ACT inhaler 2 inhalations twice a day 30.6 g 2   doxycycline (VIBRA-TABS) 100 MG tablet Take 100 mg by mouth 2 (two) times daily.     esomeprazole  (NEXIUM ) 40 MG capsule TAKE 1 CAPSULE DAILY , CAN INCREASE TO TWO TIMES A DAYDURING FLARE-UP 180 capsule 0   Misc Natural Products (TURMERIC CURCUMIN) CAPS Take 1 capsule by mouth daily. Unknown strength     mometasone  (NASONEX ) 50 MCG/ACT nasal spray Use one to two sprays in each nostril once daily as directed. 51 g 1   Multiple Vitamins-Minerals (MULTIVITAMIN PO) Take 1 tablet by mouth daily. Unknown strength     Nutritional Supplements (NUTRITIONAL SUPPLEMENT PO) Take by mouth. Super C, Zinc, Vitamin D     QVAR  REDIHALER 80 MCG/ACT inhaler USE 2 INHALATIONS ORALLY WITH ALBUTEROL  EVERY 6 HOURS AS NEEDED FOR COUGH, WHEEZE, SHORTNESS OF BREATH. RINSE, GARGLE, AND SPIT AFTER USE. (USE ALONG WITH ALBUTEROL  HFA FOR FLARE-UP TREATMENT) 31.8 g 0   albuterol  (PROVENTIL ) (2.5 MG/3ML) 0.083% nebulizer solution Use one vial in the nebulizer every 4-6 hours if needed for cough or wheeze. (Patient not taking: Reported on 10/09/2024) 150 mL 1   fluconazole  (DIFLUCAN ) 150 MG tablet Take one tablet by mouth as directed. (Patient not taking: Reported on 10/09/2024) 1 tablet 0   Melatonin 5 MG CAPS Take 5 mg by mouth at bedtime as needed (sleep). (Patient not taking: Reported on 10/09/2024)     predniSONE  (DELTASONE ) 10 MG tablet Take 1 tablet once daily for 10 days (Patient not taking: Reported on 10/09/2024) 10 tablet 0   Current Facility-Administered Medications  Medication Dose Route Frequency Provider Last Rate Last Admin   Benralizumab  SOSY 30 mg  30 mg Subcutaneous Q8 Weeks Kozlow, Eric J, MD   30 mg at 09/11/24 1129    PAST MEDICAL HISTORY: Past  Medical History:  Diagnosis Date   Allergic rhinoconjunctivitis 08/30/2017   Asthma    Chronic pansinusitis 06/08/2017   Cluster headache    Complication of anesthesia 1990   Difficult to arouse and bronchospasm   COVID 09/2020   Dyspnea on exertion 03/03/2020   Essential hypertension 03/03/2020   Eustachian tube dysfunction, right 10/13/2018   Headache 07/11/2017   LPRD (laryngopharyngeal reflux disease) 08/30/2017   Middle ear effusion, right 10/13/2018   Palpitations 03/03/2020   Paresthesia 07/11/2017   Primary osteoarthritis of left hip 02/13/2019   Prostate cancer (HCC) 2020   Reflux    Sinusitis     PAST SURGICAL HISTORY: Past Surgical History:  Procedure Laterality Date   bone spur removed     CATARACT EXTRACTION     Two surgeries in Fall of 2023- both eyes   KNEE ARTHROPLASTY     x 2   SINOSCOPY     6 SURGERIES   tear duct replacement     TOTAL HIP ARTHROPLASTY Left 02/13/2019   Procedure: TOTAL HIP ARTHROPLASTY ANTERIOR APPROACH;  Surgeon: Sheril Coy, MD;  Location: [facility];  Service: Orthopedics;  Laterality: Left;    FAMILY HISTORY: Family History  Problem Relation Age of Onset   Emphysema Mother    Heart attack Mother    Asthma Father    Stroke Father     SOCIAL HISTORY: Social History   Socioeconomic History   Marital status: Married    Spouse name: Not on file   Number of children: 2   Years of education: Masters   Highest education level: Not on file  Occupational History   Occupation: Engineer, Site    Comment: Retired pensions consultant  Tobacco Use   Smoking status: Never   Smokeless tobacco: Never  Vaping Use   Vaping status: Never Used  Substance and Sexual Activity   Alcohol use: No   Drug use: No   Sexual activity: Not Currently  Other Topics Concern   Not on file  Social History Narrative   Lives at home with his wife.   Right-handed.   2.5 - 3 cups coffee per day.   Social Drivers of Health   Tobacco Use: Low Risk  (10/09/2024)   Patient History    Smoking Tobacco Use: Never    Smokeless Tobacco Use: Never    Passive Exposure: Not on file  Financial Resource Strain: Not on file  Food Insecurity: Not on file  Transportation Needs: Not on file  Physical Activity: Not on file  Stress: Not on file  Social Connections: Not on file  Intimate Partner Violence: Not on file  Depression (EYV7-0): Not on file  Alcohol Screen: Not on file  Housing: Not on file  Utilities: Not on file  Health Literacy: Not on file      Modena Callander, M.D. Ph.D.  [facility] [address], [address] [place], [place] [address] Ph: [phone] Fax: [phone]  CC:  Lenis Prentice RIGGERS [address] [place],  [place] [address]  Keren Vicenta BRAVO, MD      [1]  Allergies Allergen Reactions   Atorvastatin  Other (See Comments)    Muscle aches   Augmentin [Amoxicillin-Pot Clavulanate]     Terrible heartburn Did it involve swelling of the face/tongue/throat, SOB, or low BP? No Did it involve sudden or severe rash/hives, skin peeling, or any reaction on the inside of your mouth or nose? No Did you need to seek medical attention at a hospital or doctor's office? No When did it last happen?      2 years If all above answers are NO, may proceed with cephalosporin use.    Levaquin [Levofloxacin In D5w]     Terrible heartburn   "

## 2024-11-06 ENCOUNTER — Ambulatory Visit
